# Patient Record
Sex: Male | Born: 1960 | Race: Asian | Hispanic: No | Marital: Single | Smoking: Current every day smoker
Health system: Southern US, Community
[De-identification: ages and names within clinical notes are randomized; demographics above are authoritative.]

## PROBLEM LIST (undated history)

## (undated) DIAGNOSIS — R51 Headache: Secondary | ICD-10-CM

## (undated) DIAGNOSIS — I10 Essential (primary) hypertension: Secondary | ICD-10-CM

## (undated) DIAGNOSIS — R519 Headache, unspecified: Secondary | ICD-10-CM

## (undated) DIAGNOSIS — H269 Unspecified cataract: Secondary | ICD-10-CM

## (undated) HISTORY — DX: Unspecified cataract: H26.9

## (undated) HISTORY — DX: Essential (primary) hypertension: I10

## (undated) HISTORY — PX: OTHER SURGICAL HISTORY: SHX169

---

## 2005-07-04 ENCOUNTER — Emergency Department (HOSPITAL_COMMUNITY): Admission: EM | Admit: 2005-07-04 | Discharge: 2005-07-04 | Payer: Self-pay | Admitting: Emergency Medicine

## 2010-07-30 ENCOUNTER — Emergency Department (HOSPITAL_COMMUNITY): Payer: No Typology Code available for payment source

## 2010-07-30 ENCOUNTER — Emergency Department (HOSPITAL_COMMUNITY)
Admission: EM | Admit: 2010-07-30 | Discharge: 2010-07-30 | Disposition: A | Discharge status: Home / Self Care | Payer: No Typology Code available for payment source | Attending: Emergency Medicine | Admitting: Emergency Medicine

## 2010-07-30 DIAGNOSIS — S139XXA Sprain of joints and ligaments of unspecified parts of neck, initial encounter: Secondary | ICD-10-CM | POA: Insufficient documentation

## 2010-07-30 DIAGNOSIS — Y92410 Unspecified street and highway as the place of occurrence of the external cause: Secondary | ICD-10-CM | POA: Insufficient documentation

## 2014-04-17 ENCOUNTER — Ambulatory Visit (HOSPITAL_BASED_OUTPATIENT_CLINIC_OR_DEPARTMENT_OTHER): Payer: Medicaid Other | Admitting: *Deleted

## 2014-04-17 ENCOUNTER — Encounter: Payer: Self-pay | Admitting: Internal Medicine

## 2014-04-17 ENCOUNTER — Ambulatory Visit: Payer: Medicaid Other | Attending: Internal Medicine | Admitting: Internal Medicine

## 2014-04-17 VITALS — BP 168/105 | HR 81 | Temp 97.8°F | Resp 16 | Ht 63.0 in | Wt 121.0 lb

## 2014-04-17 DIAGNOSIS — F17210 Nicotine dependence, cigarettes, uncomplicated: Secondary | ICD-10-CM | POA: Diagnosis not present

## 2014-04-17 DIAGNOSIS — M542 Cervicalgia: Secondary | ICD-10-CM | POA: Insufficient documentation

## 2014-04-17 DIAGNOSIS — Z23 Encounter for immunization: Secondary | ICD-10-CM

## 2014-04-17 DIAGNOSIS — Z720 Tobacco use: Secondary | ICD-10-CM

## 2014-04-17 DIAGNOSIS — H269 Unspecified cataract: Secondary | ICD-10-CM | POA: Insufficient documentation

## 2014-04-17 DIAGNOSIS — M25512 Pain in left shoulder: Secondary | ICD-10-CM | POA: Insufficient documentation

## 2014-04-17 DIAGNOSIS — Z0000 Encounter for general adult medical examination without abnormal findings: Secondary | ICD-10-CM

## 2014-04-17 DIAGNOSIS — Z560 Unemployment, unspecified: Secondary | ICD-10-CM | POA: Insufficient documentation

## 2014-04-17 DIAGNOSIS — F17200 Nicotine dependence, unspecified, uncomplicated: Secondary | ICD-10-CM
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
## 2014-04-17 NOTE — Patient Instructions (Signed)
Cataract °A cataract is a clouding of the lens of the eye. When a lens becomes cloudy, vision is reduced based on the degree and nature of the clouding. Many cataracts reduce vision to some degree. Some cataracts make people more near-sighted as they develop. Other cataracts increase glare. Cataracts that are ignored and become worse can sometimes look white. The white color can be seen through the pupil. °CAUSES  °· Aging. However, cataracts may occur at any age, even in newborns. °· Certain drugs. °· Trauma to the eye. °· Certain diseases such as diabetes. °· Specific eye diseases such as chronic inflammation inside the eye or a sudden attack of a rare form of glaucoma. °· Inherited or acquired medical problems. °SYMPTOMS  °· Gradual, progressive drop in vision in the affected eye. °· Severe, rapid visual loss. This most often happens when trauma is the cause. °DIAGNOSIS  °To detect a cataract, an eye doctor examines the lens. Cataracts are best diagnosed with an exam of the eyes with the pupils enlarged (dilated) by drops.  °TREATMENT  °For an early cataract, vision may improve by using different eyeglasses or stronger lighting. If that does not help your vision, surgery is the only effective treatment. A cataract needs to be surgically removed when vision loss interferes with your everyday activities, such as driving, reading, or watching TV. A cataract may also have to be removed if it prevents examination or treatment of another eye problem. Surgery removes the cloudy lens and usually replaces it with a substitute lens (intraocular lens, IOL).  °At a time when both you and your doctor agree, the cataract will be surgically removed. If you have cataracts in both eyes, only one is usually removed at a time. This allows the operated eye to heal and be out of danger from any possible problems after surgery (such as infection or poor wound healing). In rare cases, a cataract may be doing damage to your eye. In  these cases, your caregiver may advise surgical removal right away. The vast majority of people who have cataract surgery have better vision afterward. °HOME CARE INSTRUCTIONS  °If you are not planning surgery, you may be asked to do the following: °· Use different eyeglasses. °· Use stronger or brighter lighting. °· Ask your eye doctor about reducing your medicine dose or changing medicines if it is thought that a medicine caused your cataract. Changing medicines does not make the cataract go away on its own. °· Become familiar with your surroundings. Poor vision can lead to injury. Avoid bumping into things on the affected side. You are at a higher risk for tripping or falling. °· Exercise extreme care when driving or operating machinery. °· Wear sunglasses if you are sensitive to bright light or experiencing problems with glare. °SEEK IMMEDIATE MEDICAL CARE IF:  °· You have a worsening or sudden vision loss. °· You notice redness, swelling, or increasing pain in the eye. °· You have a fever. °Document Released: 04/21/2005 Document Revised: 07/14/2011 Document Reviewed: 12/13/2010 °ExitCare® Patient Information ©2015 [employer]. This information is not intended to replace advice given to you by your health care provider. Make sure you discuss any questions you have with your health care provider. ° °

## 2014-05-01 ENCOUNTER — Ambulatory Visit: Payer: Medicaid Other | Attending: Internal Medicine

## 2014-05-01 DIAGNOSIS — Z0000 Encounter for general adult medical examination without abnormal findings: Secondary | ICD-10-CM

## 2014-05-01 LAB — CBC
HCT: 47.6 % (ref 39.0–52.0)
HEMOGLOBIN: 16.6 g/dL (ref 13.0–17.0)
MCH: 26 pg (ref 26.0–34.0)
MCHC: 34.9 g/dL (ref 30.0–36.0)
MCV: 74.6 fL — ABNORMAL LOW (ref 78.0–100.0)
MPV: 10.3 fL (ref 9.4–12.4)
PLATELETS: 231 10*3/uL (ref 150–400)
RBC: 6.38 MIL/uL — ABNORMAL HIGH (ref 4.22–5.81)
RDW: 15 % (ref 11.5–15.5)
WBC: 8.6 10*3/uL (ref 4.0–10.5)

## 2014-05-01 LAB — COMPLETE METABOLIC PANEL WITH GFR
ALBUMIN: 4.6 g/dL (ref 3.5–5.2)
ALT: 14 U/L (ref 0–53)
AST: 17 U/L (ref 0–37)
Alkaline Phosphatase: 97 U/L (ref 39–117)
BUN: 12 mg/dL (ref 6–23)
CALCIUM: 10.2 mg/dL (ref 8.4–10.5)
CHLORIDE: 103 meq/L (ref 96–112)
CO2: 28 mEq/L (ref 19–32)
Creat: 1.01 mg/dL (ref 0.50–1.35)
GFR, Est African American: >89 mL/min
GFR, Est Non African American: 83 mL/min
GLUCOSE: 115 mg/dL — AB (ref 70–99)
POTASSIUM: 5.5 meq/L — AB (ref 3.5–5.3)
Sodium: 138 mEq/L (ref 135–145)
Total Bilirubin: 0.6 mg/dL (ref 0.2–1.2)
Total Protein: 7.2 g/dL (ref 6.0–8.3)

## 2014-05-01 LAB — LIPID PANEL
CHOLESTEROL: 169 mg/dL (ref 0–200)
HDL: 56 mg/dL (ref >39)
LDL Cholesterol: 86 mg/dL (ref 0–99)
Total CHOL/HDL Ratio: 3 Ratio
Triglycerides: 134 mg/dL (ref <150)
VLDL: 27 mg/dL (ref 0–40)

## 2014-05-01 LAB — HEMOGLOBIN A1C
Hgb A1c MFr Bld: 5.6 % (ref <5.7)
Mean Plasma Glucose: 114 mg/dL (ref <117)

## 2014-05-02 LAB — TSH: TSH: 0.76 u[IU]/mL (ref 0.350–4.500)

## 2014-05-02 LAB — PSA: PSA: 2.17 ng/mL (ref <=4.00)

## 2014-05-09 ENCOUNTER — Telehealth: Payer: Self-pay | Admitting: Internal Medicine

## 2014-05-09 NOTE — Telephone Encounter (Signed)
Pt requesting lab results  please result

## 2014-05-09 NOTE — Telephone Encounter (Signed)
Patient is calling to request his blood work results, patient states that he needs to know the results in order to get eye surgery. Please f/u with pt.

## 2014-05-10 ENCOUNTER — Telehealth: Payer: Self-pay | Admitting: Emergency Medicine

## 2014-05-10 ENCOUNTER — Other Ambulatory Visit: Payer: Self-pay | Admitting: *Deleted

## 2014-05-10 ENCOUNTER — Telehealth: Payer: Self-pay | Admitting: Internal Medicine

## 2014-05-10 DIAGNOSIS — E875 Hyperkalemia: Secondary | ICD-10-CM

## 2014-05-10 MED ORDER — FUROSEMIDE 20 MG PO TABS: 20.0000 mg | ORAL_TABLET | Freq: Every day | ORAL | Status: DC

## 2014-05-10 NOTE — Telephone Encounter (Signed)
Pt explained why he needs to take Lasix medication Instructed pt to take medication x 3 dys and return to clinic for repeat BMET Scheduled 05/15/14 @ 10am Future order placed  Pt also informed of freq urination  Verbalized understanding Stated BP has been elevated but unsure of readings

## 2014-05-10 NOTE — Telephone Encounter (Signed)
Pt aware of lab results Lasix send to [facility] BMET was placed

## 2014-05-10 NOTE — Telephone Encounter (Signed)
-----   Message from Ambrose FinlandValerie A Keck, NP sent at 05/09/2014  6:57 PM EST ----- Patients potassium is elevated.  Make sure he is not taking any supplements, if he is he needs to discontinue use. Send him lasix 20 mg to take once per day for 3 days and then have him to come back for a redraw.  Explain that this will make him urinate often in order to pull some of the potassium out of his body. Please place order for future bmet. Thanks

## 2014-05-10 NOTE — Telephone Encounter (Signed)
Patient has called in today to see if he can request a medication refill; patient states BP is still elevated but does not have an appointment until next week 1/13; please f/u with patient to begin treatment

## 2014-05-15 ENCOUNTER — Other Ambulatory Visit: Payer: Medicaid Other

## 2014-05-17 ENCOUNTER — Telehealth: Payer: Self-pay | Admitting: Internal Medicine

## 2014-05-17 ENCOUNTER — Other Ambulatory Visit: Payer: Self-pay

## 2014-05-17 ENCOUNTER — Ambulatory Visit: Payer: Medicaid Other | Attending: Internal Medicine | Admitting: Internal Medicine

## 2014-05-17 ENCOUNTER — Ambulatory Visit (HOSPITAL_COMMUNITY)
Admission: RE | Admit: 2014-05-17 | Discharge: 2014-05-17 | Disposition: A | Discharge status: Home / Self Care | Payer: Medicaid Other | Source: Ambulatory Visit | Attending: Cardiology | Admitting: Cardiology

## 2014-05-17 ENCOUNTER — Ambulatory Visit: Payer: Medicaid Other

## 2014-05-17 ENCOUNTER — Encounter: Payer: Self-pay | Admitting: Internal Medicine

## 2014-05-17 VITALS — BP 165/101 | HR 66 | Temp 98.3°F | Resp 16 | Ht 63.0 in | Wt 121.0 lb

## 2014-05-17 DIAGNOSIS — E875 Hyperkalemia: Secondary | ICD-10-CM | POA: Diagnosis not present

## 2014-05-17 DIAGNOSIS — I10 Essential (primary) hypertension: Secondary | ICD-10-CM | POA: Insufficient documentation

## 2014-05-17 DIAGNOSIS — F17210 Nicotine dependence, cigarettes, uncomplicated: Secondary | ICD-10-CM | POA: Insufficient documentation

## 2014-05-17 DIAGNOSIS — Z79899 Other long term (current) drug therapy: Secondary | ICD-10-CM | POA: Insufficient documentation

## 2014-05-17 LAB — BASIC METABOLIC PANEL
BUN: 18 mg/dL (ref 6–23)
CALCIUM: 10 mg/dL (ref 8.4–10.5)
CO2: 25 meq/L (ref 19–32)
Chloride: 102 mEq/L (ref 96–112)
Creat: 0.89 mg/dL (ref 0.50–1.35)
GLUCOSE: 92 mg/dL (ref 70–99)
Potassium: 5.5 mEq/L — ABNORMAL HIGH (ref 3.5–5.3)
SODIUM: 138 meq/L (ref 135–145)

## 2014-05-17 MED ORDER — AMLODIPINE BESYLATE 5 MG PO TABS: 5.0000 mg | ORAL_TABLET | Freq: Every day | ORAL | Status: DC

## 2014-05-17 NOTE — Patient Instructions (Signed)

## 2014-05-17 NOTE — Telephone Encounter (Signed)
Medical clearance for Dr. Harlon FlorWhitaker for patient to have eye surgery was faxed over today to [phone].

## 2014-05-17 NOTE — Progress Notes (Signed)
Pt is here following up on his HTN. Pt states that is his pressure is high that he wants to be on a BP medication.

## 2014-05-17 NOTE — Telephone Encounter (Signed)
Dr. Hosie PoissonWhitaker's office called to check on the status of the request for clearance in order for the patient to have cataracts surgery. Please f/u with office for more information.

## 2014-05-17 NOTE — Progress Notes (Signed)
Patient ID: [name] [name], male   DOB: 11/05/1960, 54 y.o.   MRN: [id number]  CC: elevated BP   HPI: [name] [name] is a 54 y.o. male here today for a follow up visit.  Patient has no past medical history.  Patient reports that every time he came to the clinic to have his BP rechecked they sent him home.  He states that the pharmacy close to his house has had a broken BP machine.  He states that he has been worried about his blood pressure. He denies headache, chest pressure, or palpitations.  He only reports moderate back pain.     No Known Allergies History reviewed. No pertinent past medical history. Current Outpatient Prescriptions on File Prior to Visit  Medication Sig Dispense Refill  . furosemide (LASIX) 20 MG tablet Take 1 tablet (20 mg total) by mouth daily. (Patient not taking: Reported on 05/17/2014) 3 tablet 0   No current facility-administered medications on file prior to visit.   History reviewed. No pertinent family history. History   Social History  . Marital Status: Single    Spouse Name: N/A    Number of Children: N/A  . Years of Education: N/A   Occupational History  . Not on file.   Social History Main Topics  . Smoking status: Heavy Tobacco Smoker  . Smokeless tobacco: Not on file  . Alcohol Use: No  . Drug Use: No  . Sexual Activity: No   Other Topics Concern  . Not on file   Social History Narrative    Review of Systems: See HPI  Objective:   Filed Vitals:   05/17/14 1441  BP: 165/101  Pulse: 66  Temp: 98.3 F (36.8 C)  Resp: 16    Physical Exam  Constitutional: He is oriented to person, place, and time.  Cardiovascular: Normal rate, regular rhythm and normal heart sounds.   Pulmonary/Chest: Effort normal and breath sounds normal.  Neurological: He is alert and oriented to person, place, and time.     Lab Results  Component Value Date   WBC 8.6 05/01/2014   HGB 16.6 05/01/2014   HCT 47.6 05/01/2014   MCV 74.6* 05/01/2014   PLT 231  05/01/2014   Lab Results  Component Value Date   CREATININE 1.01 05/01/2014   BUN 12 05/01/2014   NA 138 05/01/2014   K 5.5* 05/01/2014   CL 103 05/01/2014   CO2 28 05/01/2014    Lab Results  Component Value Date   HGBA1C 5.6 05/01/2014   Lipid Panel     Component Value Date/Time   CHOL 169 05/01/2014 1020   TRIG 134 05/01/2014 1020   HDL 56 05/01/2014 1020   CHOLHDL 3.0 05/01/2014 1020   VLDL 27 05/01/2014 1020   LDLCALC 86 05/01/2014 1020       Assessment and plan:   [name] [name] was seen today for follow-up.  Diagnoses and associated orders for this visit:  Essential hypertension - amLODipine (NORVASC) 5 MG tablet; Take 1 tablet (5 mg total) by mouth daily. - EKG 12-Lead  Hyperkalemia - Basic Metabolic Panel Normal EKG  Return in about 1 week (around 05/24/2014) for Nurse Visit-BP check and 3 mo PCP.        Holland CommonsKECK, Yeiden Frenkel, NP-C [facility] [phone] 05/17/2014, 3:19 PM

## 2014-05-28 ENCOUNTER — Other Ambulatory Visit: Payer: Self-pay | Admitting: Ophthalmology

## 2014-05-28 MED ORDER — TETRACAINE HCL 0.5 % OP SOLN: 1.0000 [drp] | OPHTHALMIC | Status: AC

## 2014-05-28 NOTE — H&P (Signed)
                  History & Physical:   DATE:  05-19-14   NAME:  [name], [name]      [id number]       HISTORY OF PRESENT ILLNESS: Chief Eye Complaints  Cataract: Patient states my glasses doesn't work for me anymore due ot the cataracts. Patient c/o seeing floaters. 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 Brewington's notes.    HPI: EYES: Reports symptoms of  LOCATION:   BOTH EYES        QUALITY/COURSE:   Reports condition is worsening.        INTENSITY/SEVERITY:    Reports measurement ( or degree) as severe .      DURATION:   Reports the general length of symptoms to be years.                 ACTIVE PROBLEMS: Combined forms of age-related cataract, bilateral   ICD10: H25.813  ICD9:   Onset: 04/05/2014 16:10  Initial Date:     Hypertension   ICD10:   ICD9: 401.9  Onset: 04/05/2014 17:30  Initial Date:  SURGERIES: MEDICATIONS: Ilevro: Strength-  SIG-   Ciloxan (Ciprofloxacin) Solution: 0.3% solution SIG-  1 drop(s)  every 4 hours  REVIEW OF SYSTEMS: ROS:   GEN- Constitutional: HENT: GEN - Endocrine: Reports symptoms of LUNGS/Respiratory:  HEART/Cardiovascular: Reports symptoms of ABD/Gastrointestinal:  Musculoskeletal (BJE): +++++      arthralgias    shoulder pain or problems   NEURO/Neurological: PSYCH/Psychiatric:    Is the pt oriented to time, place, person? YES  Mood depressed __ normal   TOBACCO Smoker Status:   Current every day smoker   ICD10: Z72.0 ICD9: 305.1 Onset: 04/05/2014 16:09    Smoker Status:  SOCIAL HISTORY: Starter Pick List - Social History  FAMILY HISTORY:  Family History - 1st Degree Relatives:  Daughter alive and well.   Family History - 1st Degree Relatives:  ALLERGIES: Drug Allergies.  No Known.   Starter - Allergies - Summary:  PHYSICAL EXAMINATION: VS: BMI: 20.0.  BP: 161/102.  H: 65.00 in.  P: 72 /min.  W: 120lbs 0oz.   149/96 @ 5:34  Va     OD [place] 20/400         cc  20/CF OS [place] 20/CF         cc 20/CF [place] 20/400  EYEGLASSES:  OD +2.25 sph  OS: +2.25 +0.25 x142 ADD: +2.50   Unable to get AR  MR 04/05/2014 16:16  OD: +2.25 sph 20/CF OS: +2.75 +0.25 x142 CF ADD: unable   K's OD: 43.75 44.25 OS: 44.00 44.50  VF:  decrease vision in all four quadrants [place]  Motility orthophoria and full  PUPILS: 3mm reactive -mg  EYELIDS & OCULAR ADNEXA normal each eye  SLE: Conjunctiva Pinguecula each eye  Cornea Arcus each eye   anterior chamber  deep and quiet each eye  Iris brown each eye  Lens +3 nuclear sclerosis plus to 3 posterior subcapsular opacity  Vitreous Clear os  CCT  Ta   in mmHg    OD   16         OS 16 Time 04/05/2014 16:30   Gonio   Dilation phenyl 2.5% and trop 1%  Fundus:  optic nerve  OD  tilted nerve with pericapillary atrophy 30% cup pink color                                                                     OS difficult view secondary to cataract   Macula      OD     CLEAR                                               OS .  Difficulty seeing from cataract  Vessels narrow arterioles  Periphery Normal OD     Exam: GENERAL: Appearance: HEAD, EARS, NOSE AND THROAT: Ears-Nose (external) Inspection: Externally, nose and ears are normal in appearance and without scars, lesions, or nodules.      Hearing assessment shows no problems with normal conversation.      LUNGS and RESPIRATORY: Lung auscultation elicits no wheezing, rhonci, rales or rubs and with equal breath sounds.    Respiratory effort described as breathing is unlabored and chest movement is symmetrical.    HEART (Cardiovascular): Heart auscultation discovers regular rate and rhythm; no murmur, gallop or rub. Normal heart sounds.    ABDOMEN (Gastrointestinal): Mass/Tenderness Exam: Neither are present.     MUSCULOSKELETAL (BJE): Inspection-Palpation: No major bone, joint, tendon, or muscle changes.      NEUROLOGICAL: Alert and oriented. No major  deficits of coordination or sensation.      PSYCHIATRIC: Insight and judgment appear  both to be intact and appropriate.    Mood and affect are described as normal mood and full affect.    SKIN: Skin Inspection: No rashes or lesions  ADMITTING DIAGNOSIS: Combined forms of age-related cataract, bilateral   ICD10: H25.813  ICD9:   Onset: 04/05/2014 16:10    Hypertension   ICD10:   ICD9: 401.9  Onset: 04/05/2014 17:30  Initial Date:  SURGICAL TREATMENT PLAN: clearance from   Dr. Avbuere     phaco emulsion cataract extraction with intraocular lens implant left eye  Risk and benefits of surgery have been reviewed with the patient and the patient agrees to proceed with the surgical procedure.   Actions:     Handouts: New Handout, What is a cataract?.    ___________________________ Bradd Merlos, Jr. Starter - Inactive Problems:  

## 2014-05-29 ENCOUNTER — Ambulatory Visit: Payer: Medicaid Other | Admitting: *Deleted

## 2014-05-29 VITALS — BP 149/82 | HR 82 | Temp 97.8°F | Resp 18 | Ht 63.0 in | Wt 120.8 lb

## 2014-05-29 DIAGNOSIS — I10 Essential (primary) hypertension: Secondary | ICD-10-CM

## 2014-05-29 MED ORDER — AMLODIPINE BESYLATE 10 MG PO TABS: 10.0000 mg | ORAL_TABLET | Freq: Every day | ORAL | Status: DC

## 2014-05-29 NOTE — Progress Notes (Signed)
Patient presents for BP check Med list reviewed; states taking all meds as directed Discussed need for low sodium diet and using Mrs. Dash as alternative to salt  BP 149/82 P 82 R 18  T  97.8 oral SPO2  98%  Per PCP: Increase amlodipine to 10 mg daily Return in 2 weeks for nurse visit for BP check   Patient given literature on DASH Eating Plan  Patient advised to call for med refills at least 7 days before running out so as not to go without.

## 2014-05-29 NOTE — Patient Instructions (Signed)
DASH Eating Plan °DASH stands for "Dietary Approaches to Stop Hypertension." The DASH eating plan is a healthy eating plan that has been shown to reduce high blood pressure (hypertension). Additional health benefits may include reducing the risk of type 2 diabetes mellitus, heart disease, and stroke. The DASH eating plan may also help with weight loss. °WHAT DO I NEED TO KNOW ABOUT THE DASH EATING PLAN? °For the DASH eating plan, you will follow these general guidelines: °· Choose foods with a percent daily value for sodium of less than 5% (as listed on the food label). °· Use salt-free seasonings or herbs instead of table salt or sea salt. °· Check with your health care provider or pharmacist before using salt substitutes. °· Eat lower-sodium products, often labeled as "lower sodium" or "no salt added." °· Eat fresh foods. °· Eat more vegetables, fruits, and low-fat dairy products. °· Choose whole grains. Look for the word "whole" as the first word in the ingredient list. °· Choose fish and skinless chicken or turkey more often than red meat. Limit fish, poultry, and meat to 6 oz (170 g) each day. °· Limit sweets, desserts, sugars, and sugary drinks. °· Choose heart-healthy fats. °· Limit cheese to 1 oz (28 g) per day. °· Eat more home-cooked food and less restaurant, buffet, and fast food. °· Limit fried foods. °· Cook foods using methods other than frying. °· Limit canned vegetables. If you do use them, rinse them well to decrease the sodium. °· When eating at a restaurant, ask that your food be prepared with less salt, or no salt if possible. °WHAT FOODS CAN I EAT? °Seek help from a dietitian for individual calorie needs. °Grains °Whole grain or whole wheat bread. Brown rice. Whole grain or whole wheat pasta. Quinoa, bulgur, and whole grain cereals. Low-sodium cereals. Corn or whole wheat flour tortillas. Whole grain cornbread. Whole grain crackers. Low-sodium crackers. °Vegetables °Fresh or frozen vegetables  (raw, steamed, roasted, or grilled). Low-sodium or reduced-sodium tomato and vegetable juices. Low-sodium or reduced-sodium tomato sauce and paste. Low-sodium or reduced-sodium canned vegetables.  °Fruits °All fresh, canned (in natural juice), or frozen fruits. °Meat and Other Protein Products °Ground beef (85% or leaner), grass-fed beef, or beef trimmed of fat. Skinless chicken or turkey. Ground chicken or turkey. Pork trimmed of fat. All fish and seafood. Eggs. Dried beans, peas, or lentils. Unsalted nuts and seeds. Unsalted canned beans. °Dairy °Low-fat dairy products, such as skim or 1% milk, 2% or reduced-fat cheeses, low-fat ricotta or cottage cheese, or plain low-fat yogurt. Low-sodium or reduced-sodium cheeses. °Fats and Oils °Tub margarines without trans fats. Light or reduced-fat mayonnaise and salad dressings (reduced sodium). Avocado. Safflower, olive, or canola oils. Natural peanut or almond butter. °Other °Unsalted popcorn and pretzels. °The items listed above may not be a complete list of recommended foods or beverages. Contact your dietitian for more options. °WHAT FOODS ARE NOT RECOMMENDED? °Grains °White bread. White pasta. White rice. Refined cornbread. Bagels and croissants. Crackers that contain trans fat. °Vegetables °Creamed or fried vegetables. Vegetables in a cheese sauce. Regular canned vegetables. Regular canned tomato sauce and paste. Regular tomato and vegetable juices. °Fruits °Dried fruits. Canned fruit in light or heavy syrup. Fruit juice. °Meat and Other Protein Products °Fatty cuts of meat. Ribs, chicken wings, bacon, sausage, bologna, salami, chitterlings, fatback, hot dogs, bratwurst, and packaged luncheon meats. Salted nuts and seeds. Canned beans with salt. °Dairy °Whole or 2% milk, cream, half-and-half, and cream cheese. Whole-fat or sweetened yogurt. Full-fat   cheeses or blue cheese. Nondairy creamers and whipped toppings. Processed cheese, cheese spreads, or cheese  curds. °Condiments °Onion and garlic salt, seasoned salt, table salt, and sea salt. Canned and packaged gravies. Worcestershire sauce. Tartar sauce. Barbecue sauce. Teriyaki sauce. Soy sauce, including reduced sodium. Steak sauce. Fish sauce. Oyster sauce. Cocktail sauce. Horseradish. Ketchup and mustard. Meat flavorings and tenderizers. Bouillon cubes. Hot sauce. Tabasco sauce. Marinades. Taco seasonings. Relishes. °Fats and Oils °Butter, stick margarine, lard, shortening, ghee, and bacon fat. Coconut, palm kernel, or palm oils. Regular salad dressings. °Other °Pickles and olives. Salted popcorn and pretzels. °The items listed above may not be a complete list of foods and beverages to avoid. Contact your dietitian for more information. °WHERE CAN I FIND MORE INFORMATION? °National Heart, Lung, and [employer] Institute: www.nhlbi.nih.gov/health/health-topics/topics/dash/ °Document Released: 04/10/2011 Document Revised: 09/05/2013 Document Reviewed: 02/23/2013 °ExitCare® Patient Information ©2015 [employer]. This information is not intended to replace advice given to you by your health care provider. Make sure you discuss any questions you have with your health care provider. ° °

## 2014-05-30 ENCOUNTER — Encounter (HOSPITAL_COMMUNITY)
Admission: RE | Admit: 2014-05-30 | Discharge: 2014-05-30 | Disposition: A | Discharge status: Home / Self Care | Payer: Medicaid Other | Source: Ambulatory Visit | Attending: Ophthalmology | Admitting: Ophthalmology

## 2014-05-30 ENCOUNTER — Encounter (HOSPITAL_COMMUNITY): Payer: Self-pay

## 2014-05-30 DIAGNOSIS — I10 Essential (primary) hypertension: Secondary | ICD-10-CM | POA: Diagnosis not present

## 2014-05-30 DIAGNOSIS — H269 Unspecified cataract: Secondary | ICD-10-CM | POA: Diagnosis present

## 2014-05-30 DIAGNOSIS — H53141 Visual discomfort, right eye: Secondary | ICD-10-CM | POA: Diagnosis not present

## 2014-05-30 DIAGNOSIS — F17200 Nicotine dependence, unspecified, uncomplicated: Secondary | ICD-10-CM | POA: Diagnosis not present

## 2014-05-30 HISTORY — DX: Headache: R51

## 2014-05-30 HISTORY — DX: Headache, unspecified: R51.9

## 2014-05-30 LAB — BASIC METABOLIC PANEL
ANION GAP: 8 (ref 5–15)
BUN: 14 mg/dL (ref 6–23)
CHLORIDE: 102 mmol/L (ref 96–112)
CO2: 26 mmol/L (ref 19–32)
Calcium: 9.6 mg/dL (ref 8.4–10.5)
Creatinine, Ser: 0.93 mg/dL (ref 0.50–1.35)
Glucose, Bld: 106 mg/dL — ABNORMAL HIGH (ref 70–99)
POTASSIUM: 4.4 mmol/L (ref 3.5–5.1)
SODIUM: 136 mmol/L (ref 135–145)

## 2014-05-30 LAB — CBC
HCT: 46.3 % (ref 39.0–52.0)
HEMOGLOBIN: 15.6 g/dL (ref 13.0–17.0)
MCH: 26 pg (ref 26.0–34.0)
MCHC: 33.7 g/dL (ref 30.0–36.0)
MCV: 77 fL — ABNORMAL LOW (ref 78.0–100.0)
Platelets: 213 10*3/uL (ref 150–400)
RBC: 6.01 MIL/uL — ABNORMAL HIGH (ref 4.22–5.81)
RDW: 14.5 % (ref 11.5–15.5)
WBC: 10.4 10*3/uL (ref 4.0–10.5)

## 2014-05-30 MED ORDER — CYCLOPENTOLATE HCL 1 % OP SOLN
1.0000 [drp] | OPHTHALMIC | Status: AC
  Administered 2014-05-31: 1 [drp] via OPHTHALMIC
  Filled 2014-05-30: qty 2

## 2014-05-30 MED ORDER — PHENYLEPHRINE HCL 2.5 % OP SOLN
1.0000 [drp] | OPHTHALMIC | Status: AC
Start: 2014-05-31 — End: 2014-05-31
  Administered 2014-05-31: 1 [drp] via OPHTHALMIC
  Filled 2014-05-30: qty 2

## 2014-05-30 MED ORDER — GATIFLOXACIN 0.5 % OP SOLN
1.0000 [drp] | OPHTHALMIC | Status: AC | PRN
  Administered 2014-05-31 (×3): 1 [drp] via OPHTHALMIC
  Filled 2014-05-30 (×2): qty 2.5

## 2014-05-30 MED ORDER — TROPICAMIDE 1 % OP SOLN
1.0000 [drp] | OPHTHALMIC | Status: AC
  Administered 2014-05-31: 1 [drp] via OPHTHALMIC
  Filled 2014-05-30: qty 3

## 2014-05-30 MED ORDER — KETOROLAC TROMETHAMINE 0.5 % OP SOLN
1.0000 [drp] | OPHTHALMIC | Status: AC
  Administered 2014-05-31: 1 [drp] via OPHTHALMIC
  Filled 2014-05-30: qty 5

## 2014-05-30 NOTE — Pre-Procedure Instructions (Addendum)
[name] [name]  05/30/2014   Your procedure is scheduled on:  Wednesday, January 27.  [facility] at 7:30 AM.  Call this number if you have problems the morning of surgery: [phone]   Remember:   Do not eat food or drink liquids after midnight.   Take these medicines the morning of surgery with A SIP OF WATER: amLODipine (NORVASC).    Do not wear jewelry, make-up or nail polish.  Do not wear lotions, powders, or perfumes.              Men may shave face and neck.  Do not bring valuables to the hospital.              [facility] is not responsible for any belongings or valuables.               Contacts, dentures or bridgework may not be worn into surgery.  Leave suitcase in the car. After surgery it may be brought to your room.  For patients admitted to the hospital, discharge time is determined by your  treatment team.               Patients discharged the day of surgery will not be allowed to drive home.  Name and phone number of your driver: -   Special Instructions: Review  [facility] - Preparing For Surgery.   Please read over the following fact sheets that you were given: Pain Booklet, Coughing and Deep Breathing and Surgical Site Infection Prevention

## 2014-05-30 NOTE — Progress Notes (Signed)
Pt. Verbalizing Dr.whitaker was operating on both eyes. Pt. Stating the earliest he could arrange transportation to pick him up was 7:10 am [facility]. Pt. Also stated he didn't have anyone to stay with him after surgery.  Notified Dr. Harlon FlorWhitaker. Dr. Harlon FlorWhitaker spoke to patient on the phone. Pt. Understands the doctor will operate on the left eye only . Pt. Will try and find a friend to take him home or  to ride the transportation bus home with him and also stay the night. Dr. Harlon FlorWhitaker changed the surgery time from 8:30 am to 9:30 am due to patient not being able to be picked up til 7:10 am.

## 2014-05-30 NOTE — Anesthesia Preprocedure Evaluation (Addendum)
Anesthesia Evaluation  Patient identified by MRN, date of birth, ID band Patient awake    Reviewed: Allergy & Precautions, NPO status , Patient's Chart, lab work & pertinent test results, reviewed documented beta blocker date and time   Airway Mallampati: II   Neck ROM: Full    Dental  (+) Edentulous Upper, Poor Dentition   Pulmonary Current Smoker,  breath sounds clear to auscultation        Cardiovascular hypertension, Pt. on medications  EKG 05/2014 WNL   Neuro/Psych    GI/Hepatic negative GI ROS, Neg liver ROS,   Endo/Other  negative endocrine ROS  Renal/GU negative Renal ROS     Musculoskeletal   Abdominal (+)  Abdomen: soft.    Peds  Hematology   Anesthesia Other Findings   Reproductive/Obstetrics                           Anesthesia Physical Anesthesia Plan  ASA: II  Anesthesia Plan: MAC   Post-op Pain Management:    Induction:   Airway Management Planned: Nasal Cannula  Additional Equipment:   Intra-op Plan:   Post-operative Plan:   Informed Consent: I have reviewed the patients History and Physical, chart, labs and discussed the procedure including the risks, benefits and alternatives for the proposed anesthesia with the patient or authorized representative who has indicated his/her understanding and acceptance.     Plan Discussed with:   Anesthesia Plan Comments:         Anesthesia Quick Evaluation

## 2014-05-31 ENCOUNTER — Ambulatory Visit (HOSPITAL_COMMUNITY): Payer: Medicaid Other | Admitting: Anesthesiology

## 2014-05-31 ENCOUNTER — Encounter (HOSPITAL_COMMUNITY): Payer: Self-pay | Admitting: *Deleted

## 2014-05-31 ENCOUNTER — Encounter (HOSPITAL_COMMUNITY)
Admission: RE | Disposition: A | Discharge status: Home / Self Care | Payer: Self-pay | Source: Ambulatory Visit | Attending: Ophthalmology

## 2014-05-31 ENCOUNTER — Ambulatory Visit (HOSPITAL_COMMUNITY)
Admission: RE | Admit: 2014-05-31 | Discharge: 2014-05-31 | Disposition: A | Discharge status: Home / Self Care | Payer: Medicaid Other | Source: Ambulatory Visit | Attending: Ophthalmology | Admitting: Ophthalmology

## 2014-05-31 DIAGNOSIS — I10 Essential (primary) hypertension: Secondary | ICD-10-CM | POA: Insufficient documentation

## 2014-05-31 DIAGNOSIS — H53141 Visual discomfort, right eye: Secondary | ICD-10-CM | POA: Insufficient documentation

## 2014-05-31 DIAGNOSIS — F17200 Nicotine dependence, unspecified, uncomplicated: Secondary | ICD-10-CM | POA: Insufficient documentation

## 2014-05-31 HISTORY — PX: CATARACT EXTRACTION W/PHACO: SHX586

## 2014-05-31 SURGERY — PHACOEMULSIFICATION, CATARACT, WITH IOL INSERTION
Anesthesia: Monitor Anesthesia Care | Site: Eye | Laterality: Left

## 2014-05-31 MED ORDER — MEPERIDINE HCL 25 MG/ML IJ SOLN: 6.2500 mg | INTRAMUSCULAR | Status: DC | PRN

## 2014-05-31 MED ORDER — MIDAZOLAM HCL 2 MG/2ML IJ SOLN
INTRAMUSCULAR | Status: AC
  Filled 2014-05-31: qty 2

## 2014-05-31 MED ORDER — BSS IO SOLN
INTRAOCULAR | Status: AC
  Filled 2014-05-31: qty 15

## 2014-05-31 MED ORDER — PROPOFOL 10 MG/ML IV BOLUS
INTRAVENOUS | Status: DC | PRN
  Administered 2014-05-31: 30 mg via INTRAVENOUS
  Administered 2014-05-31: 10 mg via INTRAVENOUS

## 2014-05-31 MED ORDER — PHENYLEPHRINE HCL 2.5 % OP SOLN
1.0000 [drp] | OPHTHALMIC | Status: AC | PRN
Start: 2014-05-31 — End: 2014-05-31
  Administered 2014-05-31 (×2): 1 [drp] via OPHTHALMIC
  Filled 2014-05-31: qty 2

## 2014-05-31 MED ORDER — PILOCARPINE HCL 4 % OP SOLN
OPHTHALMIC | Status: AC
  Filled 2014-05-31: qty 15

## 2014-05-31 MED ORDER — LIDOCAINE HCL 2 % IJ SOLN
INTRAMUSCULAR | Status: AC
  Filled 2014-05-31: qty 20

## 2014-05-31 MED ORDER — LIDOCAINE-EPINEPHRINE 2 %-1:100000 IJ SOLN
INTRAMUSCULAR | Status: DC | PRN
  Administered 2014-05-31: 4.5 mL via RETROBULBAR

## 2014-05-31 MED ORDER — BSS IO SOLN
INTRAOCULAR | Status: AC
  Filled 2014-05-31: qty 500

## 2014-05-31 MED ORDER — SODIUM HYALURONATE 10 MG/ML IO SOLN
INTRAOCULAR | Status: AC
  Filled 2014-05-31: qty 0.85

## 2014-05-31 MED ORDER — FENTANYL CITRATE 0.05 MG/ML IJ SOLN: 25.0000 ug | INTRAMUSCULAR | Status: DC | PRN

## 2014-05-31 MED ORDER — MIDAZOLAM HCL 5 MG/5ML IJ SOLN
INTRAMUSCULAR | Status: DC | PRN
  Administered 2014-05-31: 2 mg via INTRAVENOUS

## 2014-05-31 MED ORDER — SODIUM HYALURONATE 10 MG/ML IO SOLN
INTRAOCULAR | Status: DC | PRN
  Administered 2014-05-31: 0.85 mL via INTRAOCULAR

## 2014-05-31 MED ORDER — PROMETHAZINE HCL 25 MG/ML IJ SOLN: 6.2500 mg | INTRAMUSCULAR | Status: DC | PRN

## 2014-05-31 MED ORDER — TOBRAMYCIN 0.3 % OP OINT
TOPICAL_OINTMENT | OPHTHALMIC | Status: DC | PRN
  Administered 2014-05-31: 1 via OPHTHALMIC

## 2014-05-31 MED ORDER — TOBRAMYCIN-DEXAMETHASONE 0.3-0.1 % OP OINT
TOPICAL_OINTMENT | OPHTHALMIC | Status: AC
  Filled 2014-05-31: qty 3.5

## 2014-05-31 MED ORDER — EPINEPHRINE HCL 1 MG/ML IJ SOLN
INTRAMUSCULAR | Status: AC
  Filled 2014-05-31: qty 1

## 2014-05-31 MED ORDER — PROPOFOL 10 MG/ML IV BOLUS
INTRAVENOUS | Status: AC
  Filled 2014-05-31: qty 20

## 2014-05-31 MED ORDER — FENTANYL CITRATE 0.05 MG/ML IJ SOLN
INTRAMUSCULAR | Status: AC
  Filled 2014-05-31: qty 5

## 2014-05-31 MED ORDER — KETOROLAC TROMETHAMINE 0.5 % OP SOLN
1.0000 [drp] | OPHTHALMIC | Status: DC | PRN
Start: 2014-05-31 — End: 2014-05-31
  Administered 2014-05-31: 1 [drp] via OPHTHALMIC
  Filled 2014-05-31: qty 3

## 2014-05-31 MED ORDER — PHENYLEPHRINE HCL 10 MG/ML IJ SOLN
INTRAMUSCULAR | Status: DC | PRN
  Administered 2014-05-31 (×2): 80 ug via INTRAVENOUS

## 2014-05-31 MED ORDER — 0.9 % SODIUM CHLORIDE (POUR BTL) OPTIME
TOPICAL | Status: DC | PRN
  Administered 2014-05-31: 100 mL

## 2014-05-31 MED ORDER — CYCLOPENTOLATE HCL 1 % OP SOLN
1.0000 [drp] | OPHTHALMIC | Status: AC | PRN
  Administered 2014-05-31 (×2): 1 [drp] via OPHTHALMIC
  Filled 2014-05-31: qty 2

## 2014-05-31 MED ORDER — TROPICAMIDE 1 % OP SOLN
OPHTHALMIC | Status: AC
  Administered 2014-05-31: 1 [drp] via OPHTHALMIC
  Filled 2014-05-31: qty 3

## 2014-05-31 MED ORDER — DEXAMETHASONE SODIUM PHOSPHATE 10 MG/ML IJ SOLN
INTRAMUSCULAR | Status: AC
  Filled 2014-05-31: qty 1

## 2014-05-31 MED ORDER — TROPICAMIDE 1 % OP SOLN
1.0000 [drp] | Freq: Once | OPHTHALMIC | Status: AC
  Administered 2014-05-31: 1 [drp] via OPHTHALMIC

## 2014-05-31 MED ORDER — ACETYLCHOLINE CHLORIDE 1:100 IO SOLR
INTRAOCULAR | Status: DC | PRN
  Administered 2014-05-31: 10 mg via INTRAOCULAR

## 2014-05-31 MED ORDER — LIDOCAINE-EPINEPHRINE 2 %-1:100000 IJ SOLN
INTRAMUSCULAR | Status: AC
  Filled 2014-05-31: qty 1

## 2014-05-31 MED ORDER — SODIUM CHLORIDE 0.9 % IV SOLN
INTRAVENOUS | Status: DC
  Administered 2014-05-31: 09:00:00 via INTRAVENOUS

## 2014-05-31 MED ORDER — HYALURONIDASE HUMAN 150 UNIT/ML IJ SOLN
INTRAMUSCULAR | Status: AC
  Filled 2014-05-31: qty 1

## 2014-05-31 MED ORDER — GENTAMICIN SULFATE 40 MG/ML IJ SOLN
INTRAMUSCULAR | Status: AC
  Filled 2014-05-31: qty 2

## 2014-05-31 MED ORDER — NA CHONDROIT SULF-NA HYALURON 40-30 MG/ML IO SOLN
INTRAOCULAR | Status: DC | PRN
  Administered 2014-05-31: 0.5 mL via INTRAOCULAR

## 2014-05-31 MED ORDER — NA CHONDROIT SULF-NA HYALURON 40-30 MG/ML IO SOLN
INTRAOCULAR | Status: AC
  Filled 2014-05-31: qty 0.5

## 2014-05-31 MED ORDER — BUPIVACAINE HCL (PF) 0.75 % IJ SOLN
INTRAMUSCULAR | Status: AC
  Filled 2014-05-31: qty 10

## 2014-05-31 MED ORDER — EPINEPHRINE HCL 1 MG/ML IJ SOLN
INTRAOCULAR | Status: DC | PRN
  Administered 2014-05-31: 10:00:00

## 2014-05-31 MED ORDER — ACETYLCHOLINE CHLORIDE 1:100 IO SOLR
INTRAOCULAR | Status: AC
  Filled 2014-05-31: qty 1

## 2014-05-31 MED ORDER — FENTANYL CITRATE 0.05 MG/ML IJ SOLN
INTRAMUSCULAR | Status: DC | PRN
  Administered 2014-05-31: 25 ug via INTRAVENOUS

## 2014-05-31 MED ORDER — PHENYLEPHRINE 40 MCG/ML (10ML) SYRINGE FOR IV PUSH (FOR BLOOD PRESSURE SUPPORT)
PREFILLED_SYRINGE | INTRAVENOUS | Status: AC
  Filled 2014-05-31: qty 10

## 2014-05-31 MED ORDER — TETRACAINE HCL 0.5 % OP SOLN
OPHTHALMIC | Status: AC
  Filled 2014-05-31: qty 2

## 2014-05-31 SURGICAL SUPPLY — 35 items
APL SRG 3 HI ABS STRL LF PLS (MISCELLANEOUS) ×1
APPLICATOR COTTON TIP 6IN STRL (MISCELLANEOUS) ×3 IMPLANT
APPLICATOR DR MATTHEWS STRL (MISCELLANEOUS) ×3 IMPLANT
BLADE KERATOME 2.75 (BLADE) ×2 IMPLANT
BLADE KERATOME 2.75MM (BLADE) ×1
CANNULA ANTERIOR CHAMBER 27GA (MISCELLANEOUS) ×3 IMPLANT
CORDS BIPOLAR (ELECTRODE) IMPLANT
COVER MAYO STAND STRL (DRAPES) ×3 IMPLANT
DRAPE OPHTHALMIC 40X48 W POUCH (DRAPES) ×3 IMPLANT
DRAPE RETRACTOR (MISCELLANEOUS) ×3 IMPLANT
GLOVE BIO SURGEON STRL SZ8 (GLOVE) ×3 IMPLANT
GOWN STRL REUS W/ TWL LRG LVL3 (GOWN DISPOSABLE) ×3 IMPLANT
GOWN STRL REUS W/TWL LRG LVL3 (GOWN DISPOSABLE) ×9
KIT BASIN OR (CUSTOM PROCEDURE TRAY) ×3 IMPLANT
KIT ROOM TURNOVER OR (KITS) ×3 IMPLANT
LENS IOL ACRSF IQ PC 20.0 (Intraocular Lens) IMPLANT
LENS IOL ACRYSOF IQ POST 20.0 (Intraocular Lens) ×3 IMPLANT
NDL 18GX1X1/2 (RX/OR ONLY) (NEEDLE) ×1 IMPLANT
NDL 25GX 5/8IN NON SAFETY (NEEDLE) ×1 IMPLANT
NDL FILTER BLUNT 18X1 1/2 (NEEDLE) ×1 IMPLANT
NEEDLE 18GX1X1/2 (RX/OR ONLY) (NEEDLE) ×3 IMPLANT
NEEDLE 25GX 5/8IN NON SAFETY (NEEDLE) ×3 IMPLANT
NEEDLE FILTER BLUNT 18X 1/2SAF (NEEDLE) ×2
NEEDLE FILTER BLUNT 18X1 1/2 (NEEDLE) ×1 IMPLANT
NS IRRIG 1000ML POUR BTL (IV SOLUTION) ×3 IMPLANT
PACK CATARACT CUSTOM (CUSTOM PROCEDURE TRAY) ×3 IMPLANT
PAD ARMBOARD 7.5X6 YLW CONV (MISCELLANEOUS) ×3 IMPLANT
PAK PIK CVS CATARACT (OPHTHALMIC) ×3 IMPLANT
SUT ETHILON 10 0 CS140 6 (SUTURE) IMPLANT
SUT SILK 6 0 G 6 (SUTURE) IMPLANT
SYR TB 1ML LUER SLIP (SYRINGE) ×3 IMPLANT
TIP ABS 45DEG FLARED 0.9MM (TIP) ×3 IMPLANT
TOWEL OR 17X26 10 PK STRL BLUE (TOWEL DISPOSABLE) ×3 IMPLANT
WATER STERILE IRR 1000ML POUR (IV SOLUTION) ×3 IMPLANT
WIPE INSTRUMENT VISIWIPE 73X73 (MISCELLANEOUS) ×3 IMPLANT

## 2014-05-31 NOTE — Anesthesia Procedure Notes (Signed)
Procedure Name: MAC Date/Time: 05/31/2014 9:45 AM Performed by: Quentin OreWALKER, Easton Fetty E Pre-anesthesia Checklist: Patient identified, Emergency Drugs available, Suction available, Patient being monitored and Timeout performed Patient Re-evaluated:Patient Re-evaluated prior to inductionOxygen Delivery Method: Nasal cannula Intubation Type: IV induction Placement Confirmation: positive ETCO2

## 2014-05-31 NOTE — Interval H&P Note (Signed)
History and Physical Interval Note:  05/31/2014 9:21 AM  [name] [name]  has presented today for surgery, with the diagnosis of CATARACT  The various methods of treatment have been discussed with the patient and family. After consideration of risks, benefits and other options for treatment, the patient has consented to  Procedure(s): CATARACT EXTRACTION PHACO EMOTION AND INTRAOCULAR LENS PLACEMENT (IOC) LEFT EYE (Left) as a surgical intervention .  The patient's history has been reviewed, patient examined, no change in status, stable for surgery.  I have reviewed the patient's chart and labs.  Questions were answered to the patient's satisfaction.     [name]

## 2014-05-31 NOTE — Discharge Instructions (Signed)
What to eat:  For your first meals, you should eat lightly; only small meals initially.  If you do not have nausea, you may eat larger meals.  Avoid spicy, greasy and heavy food.    General Anesthesia, Adult, Care After  Refer to this sheet in the next few weeks. These instructions provide you with information on caring for yourself after your procedure. Your health care provider may also give you more specific instructions. Your treatment has been planned according to current medical practices, but problems sometimes occur. Call your health care provider if you have any problems or questions after your procedure.  WHAT TO EXPECT AFTER THE PROCEDURE  After the procedure, it is typical to experience:  Sleepiness.  Nausea and vomiting. HOME CARE INSTRUCTIONS  For the first 24 hours after general anesthesia:  Have a responsible person with you.  Do not drive a car. If you are alone, do not take public transportation.  Do not drink alcohol.  Do not take medicine that has not been prescribed by your health care provider.  Do not sign important papers or make important decisions.  You may resume a normal diet and activities as directed by your health care provider.  Change bandages (dressings) as directed.  If you have questions or problems that seem related to general anesthesia, call the hospital and ask for the anesthetist or anesthesiologist on call. SEEK MEDICAL CARE IF:  You have nausea and vomiting that continue the day after anesthesia.  You develop a rash. SEEK IMMEDIATE MEDICAL CARE IF:  You have difficulty breathing.  You have chest pain.  You have any allergic problems. Document Released: 07/28/2000 Document Revised: 12/22/2012 Document Reviewed: 11/04/2012  [facility] Patient Information 2014 [place] [place].    Remove eye patch at 2:30 pm Do not rub eye , Sleep on back or Right side . Avoid heavy lifting bending or straining.

## 2014-05-31 NOTE — Anesthesia Postprocedure Evaluation (Signed)
  Anesthesia Post-op Note  Patient: [name] [name]  Procedure(s) Performed: Procedure(s): CATARACT EXTRACTION PHACO EMOTION AND INTRAOCULAR LENS PLACEMENT (IOC) LEFT EYE (Left)  Patient Location: PACU  Anesthesia Type:MAC  Level of Consciousness: awake and alert   Airway and Oxygen Therapy: Patient Spontanous Breathing  Post-op Pain: none  Post-op Assessment: Post-op Vital signs reviewed and Patient's Cardiovascular Status Stable  Post-op Vital Signs: Reviewed and stable  Last Vitals:  Filed Vitals:   05/31/14 1100  BP:   Pulse: 65  Temp:   Resp:     Complications: No apparent anesthesia complications

## 2014-05-31 NOTE — Op Note (Signed)
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 Following this the patient's face prepped and draped in usual sterile fashion. With the surgeon sitting temporally and the operating microscope in position a Weck-Cel sponges used to fixate the globe and a 15 blade was used to enter through inferior clear cornea Viscoat was injected in the anterior chamber. With an additional Weck-Cel sponge to fixate the globe a 2.75 mm keratome blade was used in a stepwise fashion to enter through clear cornea. A bent 25-gauge needle was used to incise anterior capsule a continuous tear curvilinear capsulorrhexis was formed it was difficult to see all edges of the flap because of the density of the nucleus. Using the Utrata forceps the anterior capsule was removed BSS was then used to hydrodissect and hydrodelineate the nucleus the nucleus was loosened with in the capsular bag with the irrigation tip thick emulsification unit was then used to remove the epinucleus the nucleus was sculpted centrally being very dense the surgeon requested increasing the phaco power to 50% at this point the foot. Working. The phacotip was then removed from the eye the instrument had to be re-prime after re-prime in the instrument and then rechecking the tuning of the phacotip was appropriate. Therefore the phacoemulsification took place again inserting the phacotip sculpting a deep central trough and then using the snapper Hook the nucleus was snapped into 2 quadrants. Using the phacotip to then sculpted 3 additional troughs and troughs were then  divided name the nucleus into 3 pieces. All nuclear fragments were then removed from the eye and the posterior capsule remained intact irrigation-aspiration tip was then used to remove the epinucleus and strip cortical fibers the posterior capsule the posterior capsule remained intact there was adherent posterior capsule fibrosis the polisher was used the fibrosis was not able to be removed but it was peripheral not in the visual axis. Following this Provisc was injected in the anterior chamber intraocular lens implant was examined and noted to have no defects the lens was an Alcon AcrySof IQ lens SN 60 WF 20.0 dpt lens SN number [id number] the lens is placed in the lens injector and injected in the capsular bag it was rotated with a Kuglen hook following this the irrigation-aspiration handpiece was used to remove viscoelastic from the eye Miochol was injected pupil was noted to come down symmetrically round. At this point the eye was pressurized and a single 10-0 nylon suture was placed at the incision to achieve watertight closure. Following this all instruments were removed from the eye topical Tobradex ointment was applied to the eye patch and Fox shield were placed and the patient returned to recovery area in stable condition [name] [name] [name] [name] M.D.

## 2014-05-31 NOTE — H&P (View-Only) (Signed)
History & Physical:   DATE:  05-19-14   NAME:  [name] [name], [name] [name]      [id number]       HISTORY OF PRESENT ILLNESS: Chief Eye Complaints  Cataract: Patient states my glasses doesn't work for me anymore due ot the cataracts. Patient c/o seeing floaters. 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 Jacelyn Pi notes.    HPI: EYES: Reports symptoms of  LOCATION:   BOTH EYES        QUALITY/COURSE:   Reports condition is worsening.        INTENSITY/SEVERITY:    Reports measurement ( or degree) as severe .      DURATION:   Reports the general length of symptoms to be years.                 ACTIVE PROBLEMS: Combined forms of age-related cataract, bilateral   ICD10: H25.813  ICD9:   Onset: 04/05/2014 16:10  Initial Date:     Hypertension   ICD10:   ICD9: 401.9  Onset: 04/05/2014 17:30  Initial Date:  SURGERIES: MEDICATIONS: Ilevro: Strength-  SIG-   Ciloxan (Ciprofloxacin) Solution: 0.3% solution SIG-  1 drop(s)  every 4 hours  REVIEW OF SYSTEMS: ROS:   GEN- Constitutional: HENT: GEN - Endocrine: Reports symptoms of LUNGS/Respiratory:  HEART/Cardiovascular: Reports symptoms of ABD/Gastrointestinal:  Musculoskeletal (BJE): +++++      arthralgias    shoulder pain or problems   NEURO/Neurological: PSYCH/Psychiatric:    Is the pt oriented to time, place, person? YES  Mood depressed __ normal   TOBACCO Smoker Status:   Current every day smoker   ICD10: Z72.0 ICD9: 305.1 Onset: 04/05/2014 16:09    Smoker Status:  SOCIAL HISTORY: Herbalist List - Social History  FAMILY HISTORY:  Family History - 1st Degree Relatives:  Daughter alive and well.   Family History - 1st Degree Relatives:  ALLERGIES: Drug Allergies.  No Known.   Starter - Allergies - Summary:  PHYSICAL EXAMINATION: VS: BMI: 20.0.  BP: 161/102.  H: 65.00 in.  P: 72 /min.  W: 120lbs 0oz.   149/96 @ 5:34  Va     OD [name] [name] 20/400         cc  20/CF OS [name] [name] 20/CF         cc 20/CF OU [name] [name] near 20/400  EYEGLASSES:  OD +2.25 sph  OS: +2.25 +0.25 x142 ADD: +2.50   Unable to get AR  MR 04/05/2014 16:16  OD: +2.25 sph 20/CF OS: +2.75 +0.25 x142 CF ADD: unable   K's OD: 43.75 44.25 OS: 44.00 44.50  VF:  decrease vision in all four quadrants OU  Motility orthophoria and full  PUPILS: 3mm reactive -mg  EYELIDS & OCULAR ADNEXA normal each eye  SLE: Conjunctiva Pinguecula each eye  Cornea Arcus each eye   anterior chamber  deep and quiet each eye  Iris brown each eye  Lens +3 nuclear sclerosis plus to 3 posterior subcapsular opacity  Vitreous Clear os  CCT  Ta   in mmHg    OD   16         OS 16 Time 04/05/2014 16:30   Gonio   Dilation phenyl 2.5% and trop 1%  Fundus:  optic nerve  OD  tilted nerve with pericapillary atrophy 30% cup pink color  OS difficult view secondary to cataract   Macula      OD     CLEAR                                               OS .  Difficulty seeing from cataract  Vessels narrow arterioles  Periphery Normal OD     Exam: GENERAL: Appearance: HEAD, EARS, NOSE AND THROAT: Ears-Nose (external) Inspection: Externally, nose and ears are normal in appearance and without scars, lesions, or nodules.      Hearing assessment shows no problems with normal conversation.      LUNGS and RESPIRATORY: Lung auscultation elicits no wheezing, rhonci, rales or rubs and with equal breath sounds.    Respiratory effort described as breathing is unlabored and chest movement is symmetrical.    HEART (Cardiovascular): Heart auscultation discovers regular rate and rhythm; no murmur, gallop or rub. Normal heart sounds.    ABDOMEN (Gastrointestinal): Mass/Tenderness Exam: Neither are present.     MUSCULOSKELETAL (BJE): Inspection-Palpation: No major bone, joint, tendon, or muscle changes.      NEUROLOGICAL: Alert and oriented. No major  deficits of coordination or sensation.      PSYCHIATRIC: Insight and judgment appear  both to be intact and appropriate.    Mood and affect are described as normal mood and full affect.    SKIN: Skin Inspection: No rashes or lesions  ADMITTING DIAGNOSIS: Combined forms of age-related cataract, bilateral   ICD10: H25.813  ICD9:   Onset: 04/05/2014 16:10    Hypertension   ICD10:   ICD9: 401.9  Onset: 04/05/2014 17:30  Initial Date:  SURGICAL TREATMENT PLAN: clearance from   Dr. Concepcion ElkAvbuere     phaco emulsion cataract extraction with intraocular lens implant left eye  Risk and benefits of surgery have been reviewed with the patient and the patient agrees to proceed with the surgical procedure.   Actions:     Handouts: New Handout, What is a cataract?.    ___________________________ [name], [name] Starter - Inactive Problems:

## 2014-05-31 NOTE — Transfer of Care (Signed)
Immediate Anesthesia Transfer of Care Note  Patient: [name] [name]  Procedure(s) Performed: Procedure(s): CATARACT EXTRACTION PHACO EMOTION AND INTRAOCULAR LENS PLACEMENT (IOC) LEFT EYE (Left)  Patient Location: PACU  Anesthesia Type:MAC  Level of Consciousness: awake, alert  and oriented  Airway & Oxygen Therapy: Patient Spontanous Breathing and Patient connected to nasal cannula oxygen  Post-op Assessment: Report given to PACU RN, Post -op Vital signs reviewed and stable and Patient moving all extremities  Post vital signs: Reviewed and stable  Complications: No apparent anesthesia complications

## 2014-06-01 ENCOUNTER — Encounter (HOSPITAL_COMMUNITY): Payer: Self-pay | Admitting: Ophthalmology

## 2014-06-06 ENCOUNTER — Telehealth: Payer: Self-pay | Admitting: *Deleted

## 2014-06-06 MED ORDER — SODIUM POLYSTYRENE SULFONATE 15 GM/60ML PO SUSP: 15.0000 g | Freq: Once | ORAL | Status: DC

## 2014-06-06 NOTE — Telephone Encounter (Signed)
-----   Message from Ambrose FinlandValerie A Keck, NP sent at 05/19/2014  9:44 AM EST ----- Send Kayexalate 15 grams once to help lower his potassium. Tell him it will make him have slight diarrhea

## 2014-06-06 NOTE — Telephone Encounter (Signed)
Pt aware of lab result [facility]

## 2014-06-12 ENCOUNTER — Ambulatory Visit: Payer: Medicaid Other | Attending: Internal Medicine | Admitting: *Deleted

## 2014-06-12 VITALS — BP 135/80 | HR 74 | Temp 98.1°F | Resp 18

## 2014-06-12 DIAGNOSIS — I10 Essential (primary) hypertension: Secondary | ICD-10-CM | POA: Diagnosis not present

## 2014-06-12 DIAGNOSIS — Z720 Tobacco use: Secondary | ICD-10-CM | POA: Insufficient documentation

## 2014-06-12 NOTE — Patient Instructions (Signed)
DASH Eating Plan °DASH stands for "Dietary Approaches to Stop Hypertension." The DASH eating plan is a healthy eating plan that has been shown to reduce high blood pressure (hypertension). Additional health benefits may include reducing the risk of type 2 diabetes mellitus, heart disease, and stroke. The DASH eating plan may also help with weight loss. °WHAT DO I NEED TO KNOW ABOUT THE DASH EATING PLAN? °For the DASH eating plan, you will follow these general guidelines: °· Choose foods with a percent daily value for sodium of less than 5% (as listed on the food label). °· Use salt-free seasonings or herbs instead of table salt or sea salt. °· Check with your health care provider or pharmacist before using salt substitutes. °· Eat lower-sodium products, often labeled as "lower sodium" or "no salt added." °· Eat fresh foods. °· Eat more vegetables, fruits, and low-fat dairy products. °· Choose whole grains. Look for the word "whole" as the first word in the ingredient list. °· Choose fish and skinless chicken or turkey more often than red meat. Limit fish, poultry, and meat to 6 oz (170 g) each day. °· Limit sweets, desserts, sugars, and sugary drinks. °· Choose heart-healthy fats. °· Limit cheese to 1 oz (28 g) per day. °· Eat more home-cooked food and less restaurant, buffet, and fast food. °· Limit fried foods. °· Cook foods using methods other than frying. °· Limit canned vegetables. If you do use them, rinse them well to decrease the sodium. °· When eating at a restaurant, ask that your food be prepared with less salt, or no salt if possible. °WHAT FOODS CAN I EAT? °Seek help from a dietitian for individual calorie needs. °Grains °Whole grain or whole wheat bread. Brown rice. Whole grain or whole wheat pasta. Quinoa, bulgur, and whole grain cereals. Low-sodium cereals. Corn or whole wheat flour tortillas. Whole grain cornbread. Whole grain crackers. Low-sodium crackers. °Vegetables °Fresh or frozen vegetables  (raw, steamed, roasted, or grilled). Low-sodium or reduced-sodium tomato and vegetable juices. Low-sodium or reduced-sodium tomato sauce and paste. Low-sodium or reduced-sodium canned vegetables.  °Fruits °All fresh, canned (in natural juice), or frozen fruits. °Meat and Other Protein Products °Ground beef (85% or leaner), grass-fed beef, or beef trimmed of fat. Skinless chicken or turkey. Ground chicken or turkey. Pork trimmed of fat. All fish and seafood. Eggs. Dried beans, peas, or lentils. Unsalted nuts and seeds. Unsalted canned beans. °Dairy °Low-fat dairy products, such as skim or 1% milk, 2% or reduced-fat cheeses, low-fat ricotta or cottage cheese, or plain low-fat yogurt. Low-sodium or reduced-sodium cheeses. °Fats and Oils °Tub margarines without trans fats. Light or reduced-fat mayonnaise and salad dressings (reduced sodium). Avocado. Safflower, olive, or canola oils. Natural peanut or almond butter. °Other °Unsalted popcorn and pretzels. °The items listed above may not be a complete list of recommended foods or beverages. Contact your dietitian for more options. °WHAT FOODS ARE NOT RECOMMENDED? °Grains °White bread. White pasta. White rice. Refined cornbread. Bagels and croissants. Crackers that contain trans fat. °Vegetables °Creamed or fried vegetables. Vegetables in a cheese sauce. Regular canned vegetables. Regular canned tomato sauce and paste. Regular tomato and vegetable juices. °Fruits °Dried fruits. Canned fruit in light or heavy syrup. Fruit juice. °Meat and Other Protein Products °Fatty cuts of meat. Ribs, chicken wings, bacon, sausage, bologna, salami, chitterlings, fatback, hot dogs, bratwurst, and packaged luncheon meats. Salted nuts and seeds. Canned beans with salt. °Dairy °Whole or 2% milk, cream, half-and-half, and cream cheese. Whole-fat or sweetened yogurt. Full-fat   cheeses or blue cheese. Nondairy creamers and whipped toppings. Processed cheese, cheese spreads, or cheese  curds. °Condiments °Onion and garlic salt, seasoned salt, table salt, and sea salt. Canned and packaged gravies. Worcestershire sauce. Tartar sauce. Barbecue sauce. Teriyaki sauce. Soy sauce, including reduced sodium. Steak sauce. Fish sauce. Oyster sauce. Cocktail sauce. Horseradish. Ketchup and mustard. Meat flavorings and tenderizers. Bouillon cubes. Hot sauce. Tabasco sauce. Marinades. Taco seasonings. Relishes. °Fats and Oils °Butter, stick margarine, lard, shortening, ghee, and bacon fat. Coconut, palm kernel, or palm oils. Regular salad dressings. °Other °Pickles and olives. Salted popcorn and pretzels. °The items listed above may not be a complete list of foods and beverages to avoid. Contact your dietitian for more information. °WHERE CAN I FIND MORE INFORMATION? °National Heart, Lung, and [employer] Institute: www.nhlbi.nih.gov/health/health-topics/topics/dash/ °Document Released: 04/10/2011 Document Revised: 09/05/2013 Document Reviewed: 02/23/2013 °ExitCare® Patient Information ©2015 [employer]. This information is not intended to replace advice given to you by your health care provider. Make sure you discuss any questions you have with your health care provider. ° °

## 2014-06-12 NOTE — Progress Notes (Signed)
Patient presents for BP check Med list reviewed; states taking all meds as directed Discussed need for low sodium diet and using Mrs. Dash as alternative to salt Discussed walking 30 minutes per day for exercise Patient denies headaches, blurred vision, SHOB, chest pain or presseure Smoking .5 ppd; trying to quit  BP 135/80 P 74 R 18  T 98.1 oral SPO2  100%  Verified with PCP that kayexalate was not to be taken. Patient's potassium level was WNL following 3 days of lasix  Patient advised to call for med refills at least 7 days before running out so as not to go without. Patient aware that he is to f/u with PCP 3 months from last visit (Due 08/1314)

## 2014-07-10 ENCOUNTER — Other Ambulatory Visit: Payer: Self-pay | Admitting: Ophthalmology

## 2014-07-10 ENCOUNTER — Encounter (HOSPITAL_COMMUNITY): Payer: Self-pay

## 2014-07-10 ENCOUNTER — Encounter (HOSPITAL_COMMUNITY)
Admission: RE | Admit: 2014-07-10 | Discharge: 2014-07-10 | Disposition: A | Discharge status: Home / Self Care | Payer: Medicaid Other | Source: Ambulatory Visit | Attending: Ophthalmology | Admitting: Ophthalmology

## 2014-07-10 DIAGNOSIS — Z961 Presence of intraocular lens: Secondary | ICD-10-CM | POA: Diagnosis not present

## 2014-07-10 DIAGNOSIS — F17210 Nicotine dependence, cigarettes, uncomplicated: Secondary | ICD-10-CM | POA: Diagnosis not present

## 2014-07-10 DIAGNOSIS — H25811 Combined forms of age-related cataract, right eye: Secondary | ICD-10-CM | POA: Diagnosis present

## 2014-07-10 DIAGNOSIS — I10 Essential (primary) hypertension: Secondary | ICD-10-CM | POA: Diagnosis not present

## 2014-07-10 LAB — CBC
HCT: 44.3 % (ref 39.0–52.0)
HEMOGLOBIN: 14.2 g/dL (ref 13.0–17.0)
MCH: 24.6 pg — ABNORMAL LOW (ref 26.0–34.0)
MCHC: 32.1 g/dL (ref 30.0–36.0)
MCV: 76.8 fL — ABNORMAL LOW (ref 78.0–100.0)
Platelets: 213 10*3/uL (ref 150–400)
RBC: 5.77 MIL/uL (ref 4.22–5.81)
RDW: 15.3 % (ref 11.5–15.5)
WBC: 9.5 10*3/uL (ref 4.0–10.5)

## 2014-07-10 LAB — BASIC METABOLIC PANEL
ANION GAP: 5 (ref 5–15)
BUN: 15 mg/dL (ref 6–23)
CHLORIDE: 103 mmol/L (ref 96–112)
CO2: 30 mmol/L (ref 19–32)
Calcium: 9.1 mg/dL (ref 8.4–10.5)
Creatinine, Ser: 1.09 mg/dL (ref 0.50–1.35)
GFR calc Af Amer: 87 mL/min — ABNORMAL LOW (ref >90)
GFR calc non Af Amer: 75 mL/min — ABNORMAL LOW (ref >90)
Glucose, Bld: 105 mg/dL — ABNORMAL HIGH (ref 70–99)
POTASSIUM: 3.9 mmol/L (ref 3.5–5.1)
Sodium: 138 mmol/L (ref 135–145)

## 2014-07-10 MED ORDER — TETRACAINE HCL 0.5 % OP SOLN: 1.0000 [drp] | OPHTHALMIC | Status: AC

## 2014-07-10 NOTE — Progress Notes (Signed)
PCP: Tyson DenseValerie Keck,NP

## 2014-07-10 NOTE — Progress Notes (Signed)
Spoke to [place] in Dr. Hosie PoissonWhitaker's office, orders for consent and any pre-op med. Not in epic.

## 2014-07-10 NOTE — H&P (Signed)
                  History & Physical:   DATE:   07-04-14  NAME:  [name] [name], [name] [name]      [id number]       HISTORY OF PRESENT ILLNESS: Chief Eye Complaints  Va is  blurry OD per pt.   Cataract:  3 WEEKS PO CE w IOL OS.  HPI: EYES: Reports symptoms of PROBLEMS DRIVING & WATCHING TV.     LOCATION:   BOTH EYES        QUALITY/COURSE:   Reports condition is worsening.        INTENSITY/SEVERITY:    Reports measurement ( or degree) as severe .      DURATION:   Reports the general length of symptoms to be years.     ACTIVE PROBLEMS: Pseudophakia   ICD10: Z96.1 ICD9: 446.0 Onset: 06/01/2014 13:57   Combined forms of age-related cataract, right eye   ICD10: H25.811  ICD9:   Onset: 04/05/2014 16:10    Hypertension   ICD10:   ICD9: 401.9  Onset: 04/05/2014 17:30  Initial Date:  SURGERIES: 05/31/14  phaco emulsion cataract extraction   w/ IOL OS Pick List - Surgeries  MEDICATIONS: Amlodipine (Norvasc):   5 mg tablet  SIG-  1 each   once a day   Ilevro: Strength-  SIG-    Ciloxan (Ciprofloxacin) Solution: 0.3% solution SIG-  2 drop(s)  every 4 hours  REVIEW OF SYSTEMS: ROS:   GEN- Constitutional: HENT: GEN - Endocrine: Reports symptoms of LUNGS/Respiratory:  HEART/Cardiovascular: Reports symptoms of ABD/Gastrointestinal:  Musculoskeletal (BJE):  arthralgias    shoulder pain or problems   NEURO/Neurological: PSYCH/Psychiatric:    Is the pt oriented to time, place, person? YES  Mood normal    TOBACCO: Tobacco Use Cessation IVNTJ Counseling.   #4000F  Relates Dxs-  Modifiers-  Place of Service:        Smoker Status:   Current every day smoker   ICD10: Z72.0 ICD9: 305.1 Onset: 04/05/2014 16:09    SOCIAL HISTORY: noncontributory  FAMILY HISTORY: Family History - 1st Degree Relatives:  Daughter alive and well.    ALLERGIES: Drug Allergies.  No Known.     PHYSICAL EXAMINATION: VS: BMI: 20.0.  BP: 161/102.  H: 65.00 in.  P: 72 /min.  W: 120lbs 0oz.   149/96 @ 5:34  Va     OD [place] 20/400          cc 20/CF OS [place] 20/CF         cc 20/CF OU [place] near 20/400  EYEGLASSES:  OD +2.25 sph  OS: +2.25 +0.25 x142 ADD: +2.50   Unable to get AR  MR 04/05/2014 16:16  OD: +2.25 sph 20/CF OS: +2.75 +0.25 x142 CF ADD: unable   K's OD: 43.75 44.25 OS: 44.00 44.50  VF:  decrease vision in all four quadrants OU  Motility orthophoria and full  PUPILS: 3mm reactive -mg  EYELIDS & OCULAR ADNEXA normal each eye  SLE: Conjunctiva Pinguecula each eye  Cornea Arcus each eye   anterior chamber  deep and quiet each eye  Iris brown each eye  Lens +3 nuclear sclerosis plus to 3 posterior subcapsular opacity  Vitreous Clear os  CCT  Ta   in mmHg    OD   16         OS 16 Time 04/05/2014 16:30   Dilation phenyl 2.5% and trop 1%  Fundus: optic nerve    OD  tilted nerve with pericapillary atrophy 30% cup pink color                                                                   OS difficult view secondary to cataract   Macula      OD     CLEAR                                               OS .  Difficulty seeing from cataract  Vessels narrow arterioles  Periphery Normal OD     Exam: GENERAL: Appearance: HEAD, EARS, NOSE AND THROAT: Ears-Nose (external) Inspection: Externally, nose and ears are normal in appearance and without scars, lesions, or nodules.      Hearing assessment shows no problems with normal conversation.      LUNGS and RESPIRATORY: Lung auscultation elicits no wheezing, rhonci, rales or rubs and with equal breath sounds.    Respiratory effort described as breathing is unlabored and chest movement is symmetrical.    HEART (Cardiovascular): Heart auscultation discovers regular rate and rhythm; no murmur, gallop or rub. Normal heart sounds.    ABDOMEN (Gastrointestinal): Mass/Tenderness Exam: Neither are present.     MUSCULOSKELETAL (BJE): Inspection-Palpation: No major bone, joint, tendon, or muscle changes.      NEUROLOGICAL: Alert and oriented. No major  deficits of coordination or sensation.      PSYCHIATRIC: Insight and judgment appear  both to be intact and appropriate.    Mood and affect are described as normal mood and full affect.    SKIN: Skin Inspection: No rashes or lesions  ADMITTING DIAGNOSIS: Combined forms of age-related cataract, right eye   ICD10: H25.811  ICD9:   Onset: 04/05/2014 16:10 stable post OP  phaco emulsion cataract extraction  w  intraocular lens implant  OS  Pseudophakia   ICD10: Z96.1 ICD9: 446.0 Onset: 06/01/2014 13:56   Hypertension   ICD10:   ICD9: 401.9  Onset: 04/05/2014 17:30  Initial Date:  SURGICAL TREATMENT PLAN: phaco emulsion cataract extraction  w  intraocular lens implant  OD   Risk and benefits of surgery have been reviewed with the patient and the patient agrees to proceed with the surgical procedure.            ___________________________ Naszir Cott, Jr. Starter - Inactive Problems:  

## 2014-07-10 NOTE — Pre-Procedure Instructions (Signed)
[name] [name]  07/10/2014   Your procedure is scheduled on:  Wednesday, March 9  [facility] at 10:30 AM.  Call this number if you have problems the morning of surgery: [phone]              If any questions prior to surgery call pre-admission [phone] (Monday-Friday 8:00am -4:00pm)   Remember:   Do not eat food or drink liquids after midnight.   Take these medicines the morning of surgery with A SIP OF WATER: tylenol if needed, amlodipine (norvasc)             Stop aleve today.   Do not wear jewelry, make-up or nail polish.  Do not wear lotions, powders, or perfumes. You may wear deodorant.  Do not shave 48 hours prior to surgery. Men may shave face and neck.  Do not bring valuables to the hospital.  [facility] is not responsible                  for any belongings or valuables.               Contacts, dentures or bridgework may not be worn into surgery.  Leave suitcase in the car. After surgery it may be brought to your room.  For patients admitted to the hospital, discharge time is determined by your                treatment team.               Patients discharged the day of surgery will not be allowed to drive  home.  Name and phone number of your driver:   Special Instructions: review preparing for surgery handout   Please read over the following fact sheets that you were given: Pain Booklet, Coughing and Deep Breathing and Surgical Site Infection Prevention

## 2014-07-12 ENCOUNTER — Encounter (HOSPITAL_COMMUNITY)
Admission: RE | Disposition: A | Discharge status: Home / Self Care | Payer: Self-pay | Source: Ambulatory Visit | Attending: Ophthalmology

## 2014-07-12 ENCOUNTER — Ambulatory Visit (HOSPITAL_COMMUNITY): Payer: Medicaid Other | Admitting: Certified Registered Nurse Anesthetist

## 2014-07-12 ENCOUNTER — Ambulatory Visit (HOSPITAL_COMMUNITY)
Admission: RE | Admit: 2014-07-12 | Discharge: 2014-07-12 | Disposition: A | Discharge status: Home / Self Care | Payer: Medicaid Other | Source: Ambulatory Visit | Attending: Ophthalmology | Admitting: Ophthalmology

## 2014-07-12 ENCOUNTER — Encounter (HOSPITAL_COMMUNITY): Payer: Self-pay | Admitting: *Deleted

## 2014-07-12 DIAGNOSIS — I10 Essential (primary) hypertension: Secondary | ICD-10-CM | POA: Diagnosis not present

## 2014-07-12 DIAGNOSIS — F17210 Nicotine dependence, cigarettes, uncomplicated: Secondary | ICD-10-CM | POA: Diagnosis not present

## 2014-07-12 DIAGNOSIS — H25811 Combined forms of age-related cataract, right eye: Secondary | ICD-10-CM | POA: Diagnosis not present

## 2014-07-12 DIAGNOSIS — Z961 Presence of intraocular lens: Secondary | ICD-10-CM | POA: Diagnosis not present

## 2014-07-12 HISTORY — PX: CATARACT EXTRACTION W/PHACO: SHX586

## 2014-07-12 SURGERY — PHACOEMULSIFICATION, CATARACT, WITH IOL INSERTION
Anesthesia: Monitor Anesthesia Care | Site: Eye | Laterality: Right

## 2014-07-12 MED ORDER — MIDAZOLAM HCL 2 MG/2ML IJ SOLN
INTRAMUSCULAR | Status: AC
  Filled 2014-07-12: qty 2

## 2014-07-12 MED ORDER — DEXAMETHASONE SODIUM PHOSPHATE 10 MG/ML IJ SOLN
INTRAMUSCULAR | Status: AC
  Filled 2014-07-12: qty 1

## 2014-07-12 MED ORDER — GATIFLOXACIN 0.5 % OP SOLN
1.0000 [drp] | OPHTHALMIC | Status: AC | PRN
  Administered 2014-07-12 (×3): 1 [drp] via OPHTHALMIC
  Filled 2014-07-12: qty 2.5

## 2014-07-12 MED ORDER — LIDOCAINE-EPINEPHRINE 2 %-1:100000 IJ SOLN
INTRAMUSCULAR | Status: DC | PRN
  Administered 2014-07-12: 5 mL via RETROBULBAR

## 2014-07-12 MED ORDER — 0.9 % SODIUM CHLORIDE (POUR BTL) OPTIME
TOPICAL | Status: DC | PRN
  Administered 2014-07-12: 1000 mL

## 2014-07-12 MED ORDER — MIDAZOLAM HCL 5 MG/5ML IJ SOLN
INTRAMUSCULAR | Status: DC | PRN
  Administered 2014-07-12: 2 mg via INTRAVENOUS

## 2014-07-12 MED ORDER — TETRACAINE HCL 0.5 % OP SOLN
OPHTHALMIC | Status: AC
  Filled 2014-07-12: qty 2

## 2014-07-12 MED ORDER — TROPICAMIDE 1 % OP SOLN
1.0000 [drp] | OPHTHALMIC | Status: AC
  Administered 2014-07-12 (×3): 1 [drp] via OPHTHALMIC
  Filled 2014-07-12: qty 3

## 2014-07-12 MED ORDER — NA CHONDROIT SULF-NA HYALURON 40-30 MG/ML IO SOLN
INTRAOCULAR | Status: AC
  Filled 2014-07-12: qty 0.5

## 2014-07-12 MED ORDER — FENTANYL CITRATE 0.05 MG/ML IJ SOLN
INTRAMUSCULAR | Status: DC | PRN
  Administered 2014-07-12: 100 ug via INTRAVENOUS

## 2014-07-12 MED ORDER — HYALURONIDASE HUMAN 150 UNIT/ML IJ SOLN
INTRAMUSCULAR | Status: AC
  Filled 2014-07-12: qty 1

## 2014-07-12 MED ORDER — ACETYLCHOLINE CHLORIDE 1:100 IO SOLR
INTRAOCULAR | Status: DC | PRN
  Administered 2014-07-12: 10 mg via INTRAOCULAR

## 2014-07-12 MED ORDER — PROPOFOL 10 MG/ML IV BOLUS
INTRAVENOUS | Status: DC | PRN
  Administered 2014-07-12: 30 mg via INTRAVENOUS

## 2014-07-12 MED ORDER — PHENYLEPHRINE HCL 2.5 % OP SOLN
1.0000 [drp] | OPHTHALMIC | Status: AC
  Administered 2014-07-12 (×3): 1 [drp] via OPHTHALMIC
  Filled 2014-07-12: qty 2

## 2014-07-12 MED ORDER — FENTANYL CITRATE 0.05 MG/ML IJ SOLN
INTRAMUSCULAR | Status: AC
  Filled 2014-07-12: qty 5

## 2014-07-12 MED ORDER — KETOROLAC TROMETHAMINE 0.5 % OP SOLN
1.0000 [drp] | OPHTHALMIC | Status: AC
  Administered 2014-07-12 (×3): 1 [drp] via OPHTHALMIC
  Filled 2014-07-12: qty 5

## 2014-07-12 MED ORDER — BSS IO SOLN
INTRAOCULAR | Status: AC
  Filled 2014-07-12: qty 15

## 2014-07-12 MED ORDER — SODIUM HYALURONATE 10 MG/ML IO SOLN
INTRAOCULAR | Status: AC
  Filled 2014-07-12: qty 0.85

## 2014-07-12 MED ORDER — ACETYLCHOLINE CHLORIDE 1:100 IO SOLR
INTRAOCULAR | Status: AC
  Filled 2014-07-12: qty 1

## 2014-07-12 MED ORDER — LIDOCAINE HCL 2 % IJ SOLN
INTRAMUSCULAR | Status: AC
  Filled 2014-07-12: qty 20

## 2014-07-12 MED ORDER — CYCLOPENTOLATE HCL 1 % OP SOLN
1.0000 [drp] | OPHTHALMIC | Status: AC
  Administered 2014-07-12 (×3): 1 [drp] via OPHTHALMIC
  Filled 2014-07-12: qty 2

## 2014-07-12 MED ORDER — BUPIVACAINE HCL (PF) 0.75 % IJ SOLN
INTRAMUSCULAR | Status: AC
  Filled 2014-07-12: qty 10

## 2014-07-12 MED ORDER — TETRACAINE HCL 0.5 % OP SOLN
OPHTHALMIC | Status: DC | PRN
  Administered 2014-07-12: 1 [drp] via OPHTHALMIC

## 2014-07-12 MED ORDER — SODIUM CHLORIDE 0.9 % IV SOLN
INTRAVENOUS | Status: DC
  Administered 2014-07-12: 11:00:00 via INTRAVENOUS

## 2014-07-12 MED ORDER — EPINEPHRINE HCL 1 MG/ML IJ SOLN
INTRAMUSCULAR | Status: AC
  Filled 2014-07-12: qty 1

## 2014-07-12 MED ORDER — SODIUM CHLORIDE 0.9 % IV SOLN
INTRAVENOUS | Status: DC | PRN
  Administered 2014-07-12 (×2): via INTRAVENOUS

## 2014-07-12 MED ORDER — GENTAMICIN SULFATE 40 MG/ML IJ SOLN
INTRAMUSCULAR | Status: AC
  Filled 2014-07-12: qty 2

## 2014-07-12 MED ORDER — TOBRAMYCIN 0.3 % OP OINT
TOPICAL_OINTMENT | OPHTHALMIC | Status: DC | PRN
  Administered 2014-07-12: 1 via OPHTHALMIC

## 2014-07-12 MED ORDER — NA CHONDROIT SULF-NA HYALURON 40-30 MG/ML IO SOLN
INTRAOCULAR | Status: DC | PRN
  Administered 2014-07-12: 0.5 mL via INTRAOCULAR

## 2014-07-12 MED ORDER — TOBRAMYCIN-DEXAMETHASONE 0.3-0.1 % OP OINT
TOPICAL_OINTMENT | OPHTHALMIC | Status: AC
  Filled 2014-07-12: qty 3.5

## 2014-07-12 MED ORDER — EPINEPHRINE HCL 1 MG/ML IJ SOLN
INTRAOCULAR | Status: DC | PRN
  Administered 2014-07-12: 12:00:00

## 2014-07-12 MED ORDER — PHENYLEPHRINE HCL 10 MG/ML IJ SOLN
INTRAMUSCULAR | Status: DC | PRN
  Administered 2014-07-12: 60 ug via INTRAVENOUS

## 2014-07-12 MED ORDER — LIDOCAINE-EPINEPHRINE 2 %-1:100000 IJ SOLN
INTRAMUSCULAR | Status: AC
  Filled 2014-07-12: qty 1

## 2014-07-12 MED ORDER — SODIUM HYALURONATE 10 MG/ML IO SOLN
INTRAOCULAR | Status: DC | PRN
  Administered 2014-07-12: 0.85 mL via INTRAOCULAR

## 2014-07-12 MED ORDER — BSS IO SOLN
INTRAOCULAR | Status: DC | PRN
  Administered 2014-07-12: 15 mL via INTRAOCULAR

## 2014-07-12 SURGICAL SUPPLY — 37 items
APL SRG 3 HI ABS STRL LF PLS (MISCELLANEOUS) ×1
APPLICATOR COTTON TIP 6IN STRL (MISCELLANEOUS) ×3 IMPLANT
APPLICATOR DR MATTHEWS STRL (MISCELLANEOUS) ×3 IMPLANT
BLADE KERATOME 2.75 (BLADE) ×2 IMPLANT
BLADE KERATOME 2.75MM (BLADE) ×1
CANNULA ANTERIOR CHAMBER 27GA (MISCELLANEOUS) ×3 IMPLANT
CORDS BIPOLAR (ELECTRODE) IMPLANT
COVER MAYO STAND STRL (DRAPES) ×3 IMPLANT
DRAPE OPHTHALMIC 40X48 W POUCH (DRAPES) ×3 IMPLANT
DRAPE RETRACTOR (MISCELLANEOUS) ×3 IMPLANT
GLOVE BIO SURGEON STRL SZ7.5 (GLOVE) ×2 IMPLANT
GLOVE BIO SURGEON STRL SZ8 (GLOVE) ×3 IMPLANT
GLOVE SURG SS PI 7.0 STRL IVOR (GLOVE) ×2 IMPLANT
GOWN STRL REUS W/ TWL LRG LVL3 (GOWN DISPOSABLE) ×2 IMPLANT
GOWN STRL REUS W/TWL LRG LVL3 (GOWN DISPOSABLE) ×6
KIT BASIN OR (CUSTOM PROCEDURE TRAY) ×3 IMPLANT
KIT ROOM TURNOVER OR (KITS) ×3 IMPLANT
LENS IOL ACRSF IQ PC 20.0 (Intraocular Lens) IMPLANT
LENS IOL ACRYSOF IQ POST 20.0 (Intraocular Lens) ×3 IMPLANT
NDL 18GX1X1/2 (RX/OR ONLY) (NEEDLE) ×1 IMPLANT
NDL 25GX 5/8IN NON SAFETY (NEEDLE) ×1 IMPLANT
NDL FILTER BLUNT 18X1 1/2 (NEEDLE) ×1 IMPLANT
NEEDLE 18GX1X1/2 (RX/OR ONLY) (NEEDLE) ×3 IMPLANT
NEEDLE 25GX 5/8IN NON SAFETY (NEEDLE) ×3 IMPLANT
NEEDLE FILTER BLUNT 18X 1/2SAF (NEEDLE) ×2
NEEDLE FILTER BLUNT 18X1 1/2 (NEEDLE) ×1 IMPLANT
NS IRRIG 1000ML POUR BTL (IV SOLUTION) ×3 IMPLANT
PACK CATARACT CUSTOM (CUSTOM PROCEDURE TRAY) ×3 IMPLANT
PAD ARMBOARD 7.5X6 YLW CONV (MISCELLANEOUS) ×3 IMPLANT
PAK PIK CVS CATARACT (OPHTHALMIC) ×3 IMPLANT
SUT ETHILON 10 0 CS140 6 (SUTURE) IMPLANT
SUT SILK 6 0 G 6 (SUTURE) IMPLANT
SYR TB 1ML LUER SLIP (SYRINGE) ×3 IMPLANT
TIP ABS 45DEG FLARED 0.9MM (TIP) ×3 IMPLANT
TOWEL OR 17X26 10 PK STRL BLUE (TOWEL DISPOSABLE) ×3 IMPLANT
WATER STERILE IRR 1000ML POUR (IV SOLUTION) ×3 IMPLANT
WIPE INSTRUMENT VISIWIPE 73X73 (MISCELLANEOUS) ×3 IMPLANT

## 2014-07-12 NOTE — Transfer of Care (Signed)
Immediate Anesthesia Transfer of Care Note  Patient: [name] [name]  Procedure(s) Performed: Procedure(s): CATARACT EXTRACTION PHACO AND INTRAOCULAR LENS PLACEMENT (IOC) (Right)  Patient Location: PACU  Anesthesia Type:MAC  Level of Consciousness: awake, alert  and oriented  Airway & Oxygen Therapy: Patient Spontanous Breathing and Patient connected to nasal cannula oxygen  Post-op Assessment: Report given to RN and Post -op Vital signs reviewed and stable  Post vital signs: Reviewed and stable  Last Vitals:  Filed Vitals:   07/12/14 1044  BP: 126/71  Pulse: 77  Temp: 36.5 C  Resp: 18    Complications: No apparent anesthesia complications

## 2014-07-12 NOTE — Op Note (Signed)
Preoperative diagnosis: Visually significant cataract right eye Postoperative diagnosis: Same Procedure: Phacoemulsification with intraocular lens implant Anesthesia: 2% Xylocaine with epinephrine in a 50-50 mixture 0.75% Marcaine with an ampule of Wydase Complications: None Procedure: The patient was transported to the operating room where he was given a peribulbar block with the aforementioned local anesthetic agent. With the surgeon sitting temporally and the operating microscope in position a Weck-Cel sponges used to fixate the globe and a 15 blade was used to enter the anterior chamber through superior clear cornea. Viscoat was injected into the anterior chamber using an additional Weck-Cel sponge to fixate the globe a 3.5 mm keratome blade was used in a stepwise fashion through temporal cornea. A bent 25-gauge needle was used to incise anterior capsule and a continuous tear curvilinear capsulorrhexis was formed. The capsule forceps were used to remove the anterior capsule BSS was used to hydrodissect and hydrodelineate the nucleus indicating a Gold ring around the nucleus. The nucleus was loosened in the capsular bag with the cannula. The phacoemulsification unit was then used to sculpt the nucleus the nucleus was very firm necessitating a fake oh power of 50% and she'll sculpting took place then using the snapper hook and the phacotip the nucleus was divided into 4 quadrants all nuclear quadrants were removed from the eye and the posterior capsule remained intact. Following this the irrigation-aspiration device was then used to strip cortical fibers from the posterior capsule after all cortical fibers had been removed the posterior capsule was polished. Provisc was injected into the anterior chamber and the intraocular lens implant was examined and noted to have no defects the lens was an Alcon AcrySof SN 60 WF IQ lens 20.0  diopter  power SN number [id number] the lens is placed in the lens injector  and then injected in the capsular bag and positioned with a Kuglen hook. the irrigation-aspiration handpiece was then used to remove the viscoelastic from the eye the lens was centered a single 10-0 nylon suture was placed to achieve watertight closure after the eye was pressurized. There being no leakage all instruments were removed from the eye and topical Tobradex ointment was applied to the eye. A patch and Fox U were placed and the patient returned to recovery area in stable condition [name] [name] M.D.  

## 2014-07-12 NOTE — Anesthesia Postprocedure Evaluation (Signed)
  Anesthesia Post-op Note  Patient: [name] [name]  Procedure(s) Performed: Procedure(s): CATARACT EXTRACTION PHACO AND INTRAOCULAR LENS PLACEMENT (IOC) (Right)  Patient Location: PACU  Anesthesia Type:MAC  Level of Consciousness: awake  Airway and Oxygen Therapy: Patient Spontanous Breathing  Post-op Pain: mild  Post-op Assessment: Post-op Vital signs reviewed  Post-op Vital Signs: Reviewed  Last Vitals:  Filed Vitals:   07/12/14 1330  BP: 126/66  Pulse: 60  Temp:   Resp: 13    Complications: No apparent anesthesia complications

## 2014-07-12 NOTE — Discharge Instructions (Signed)
Sleep on your back or side opposite to the surgery eye. Do not apply pressure to the eye avoid heavy lifting bending or straining. The eye patch may be removed at 4:30 today. Applied the eyedrops given to the patient at doctor's office at 4:30 today and also again in the morning. May take extra-strength Tylenol every 4-6 hours as needed for pain.

## 2014-07-12 NOTE — Interval H&P Note (Signed)
History and Physical Interval Note:  07/12/2014 12:01 PM  [name] [name]  has presented today for surgery, with the diagnosis of COMBINED FORMS OF AGE RELATED CATARACT  RIGHT EYE  The various methods of treatment have been discussed with the patient and family. After consideration of risks, benefits and other options for treatment, the patient has consented to  Procedure(s): CATARACT EXTRACTION PHACO AND INTRAOCULAR LENS PLACEMENT (IOC) (Right) as a surgical intervention .  The patient's history has been reviewed, patient examined, no change in status, stable for surgery.  I have reviewed the patient's chart and labs.  Questions were answered to the patient's satisfaction.     Damareon Lanni

## 2014-07-12 NOTE — Op Note (Signed)
Preoperative diagnosis: Visually significant cataract right eye Postoperative diagnosis: Same Procedure: Phacoemulsification with intraocular lens implant Anesthesia: 2% Xylocaine with epinephrine in a 50-50 mixture 0.75% Marcaine with an ampule of Wydase Complications: None Procedure: The patient was transported to the operating room where he was given a peribulbar block with the aforementioned local anesthetic agent. With the surgeon sitting temporally and the operating microscope in position a Weck-Cel sponges used to fixate the globe and a 15 blade was used to enter the anterior chamber through superior clear cornea. Viscoat was injected into the anterior chamber using an additional Weck-Cel sponge to fixate the globe a 3.5 mm keratome blade was used in a stepwise fashion through temporal cornea. A bent 25-gauge needle was used to incise anterior capsule and a continuous tear curvilinear capsulorrhexis was formed. The capsule forceps were used to remove the anterior capsule BSS was used to hydrodissect and hydrodelineate the nucleus indicating a Gold ring around the nucleus. The nucleus was loosened in the capsular bag with the cannula. The phacoemulsification unit was then used to sculpt the nucleus the nucleus was very firm necessitating a fake oh power of 50% and she'll sculpting took place then using the snapper hook and the phacotip the nucleus was divided into 4 quadrants all nuclear quadrants were removed from the eye and the posterior capsule remained intact. Following this the irrigation-aspiration device was then used to strip cortical fibers from the posterior capsule after all cortical fibers had been removed the posterior capsule was polished. Provisc was injected into the anterior chamber and the intraocular lens implant was examined and noted to have no defects the lens was an Alcon AcrySof SN 60 WF IQ lens 20.0  diopter  power SN number [id number] the lens is placed in the lens injector  and then injected in the capsular bag and positioned with a Kuglen hook. the irrigation-aspiration handpiece was then used to remove the viscoelastic from the eye the lens was centered a single 10-0 nylon suture was placed to achieve watertight closure after the eye was pressurized. There being no leakage all instruments were removed from the eye and topical Tobradex ointment was applied to the eye. A patch and Fox U were placed and the patient returned to recovery area in stable condition Chalmers Guestoy [place] Surges Junior M.D.

## 2014-07-12 NOTE — Anesthesia Preprocedure Evaluation (Signed)
Anesthesia Evaluation  Patient identified by MRN, date of birth, ID band Patient awake    Airway        Dental   Pulmonary Current Smoker,          Cardiovascular hypertension,     Neuro/Psych  Headaches,    GI/Hepatic   Endo/Other    Renal/GU      Musculoskeletal   Abdominal   Peds  Hematology   Anesthesia Other Findings   Reproductive/Obstetrics                             Anesthesia Physical Anesthesia Plan  ASA: II  Anesthesia Plan: General   Post-op Pain Management:    Induction:   Airway Management Planned:   Additional Equipment:   Intra-op Plan:   Post-operative Plan:   Informed Consent:   Plan Discussed with:   Anesthesia Plan Comments:         Anesthesia Quick Evaluation

## 2014-07-12 NOTE — H&P (View-Only) (Signed)
History & Physical:   DATE:   07-04-14  NAMIdelle Jo:  [name], [name] [name]      [phone]       HISTORY OF PRESENT ILLNESS: Chief Eye Complaints  Va is  blurry OD per pt.   Cataract:  3 WEEKS PO CE w IOL OS.  HPI: EYES: Reports symptoms of PROBLEMS DRIVING & WATCHING TV.     LOCATION:   BOTH EYES        QUALITY/COURSE:   Reports condition is worsening.        INTENSITY/SEVERITY:    Reports measurement ( or degree) as severe .      DURATION:   Reports the general length of symptoms to be years.     ACTIVE PROBLEMS: Pseudophakia   ICD10: Z96.1 ICD9: 446.0 Onset: 06/01/2014 13:57   Combined forms of age-related cataract, right eye   ICD10: H25.811  ICD9:   Onset: 04/05/2014 16:10    Hypertension   ICD10:   ICD9: 401.9  Onset: 04/05/2014 17:30  Initial Date:  SURGERIES: 05/31/14  phaco emulsion cataract extraction   w/ IOL OS Pick List - Surgeries  MEDICATIONS: Amlodipine (Norvasc):   5 mg tablet  SIG-  1 each   once a day   Ilevro: Strength-  SIG-    Ciloxan (Ciprofloxacin) Solution: 0.3% solution SIG-  2 drop(s)  every 4 hours  REVIEW OF SYSTEMS: ROS:   GEN- Constitutional: HENT: GEN - Endocrine: Reports symptoms of LUNGS/Respiratory:  HEART/Cardiovascular: Reports symptoms of ABD/Gastrointestinal:  Musculoskeletal (BJE):  arthralgias    shoulder pain or problems   NEURO/Neurological: PSYCH/Psychiatric:    Is the pt oriented to time, place, person? YES  Mood normal    TOBACCO: Tobacco Use Cessation IVNTJ Counseling.   #4000F  Relates Dxs-  Modifiers-  Place of Service:        Smoker Status:   Current every day smoker   ICD10: Z72.0 ICD9: 305.1 Onset: 04/05/2014 16:09    SOCIAL HISTORY: noncontributory  FAMILY HISTORY: Family History - 1st Degree Relatives:  Daughter alive and well.    ALLERGIES: Drug Allergies.  No Known.     PHYSICAL EXAMINATION: VS: BMI: 20.0.  BP: 161/102.  H: 65.00 in.  P: 72 /min.  W: 120lbs 0oz.   149/96 @ 5:34  Va     OD [name]          cc 20/CF OS [name]         cc 20/CF OU [name]  EYEGLASSES:  OD +2.25 sph  OS: +2.25 +0.25 x142 ADD: +2.50   Unable to get AR  MR 04/05/2014 16:16  OD: +2.25 sph 20/CF OS: +2.75 +0.25 x142 CF ADD: unable   K's OD: 43.75 44.25 OS: 44.00 44.50  VF:  decrease vision in all four quadrants OU  Motility orthophoria and full  PUPILS: 3mm reactive -mg  EYELIDS & OCULAR ADNEXA normal each eye  SLE: Conjunctiva Pinguecula each eye  Cornea Arcus each eye   anterior chamber  deep and quiet each eye  Iris brown each eye  Lens +3 nuclear sclerosis plus to 3 posterior subcapsular opacity  Vitreous Clear os  CCT  Ta   in mmHg    OD   16         OS 16 Time 04/05/2014 16:30   Dilation phenyl 2.5% and trop 1%  Fundus: optic nerve  OD  tilted nerve with pericapillary atrophy 30% cup pink color                                                                   OS difficult view secondary to cataract   Macula      OD     CLEAR                                               OS .  Difficulty seeing from cataract  Vessels narrow arterioles  Periphery Normal OD     Exam: GENERAL: Appearance: HEAD, EARS, NOSE AND THROAT: Ears-Nose (external) Inspection: Externally, nose and ears are normal in appearance and without scars, lesions, or nodules.      Hearing assessment shows no problems with normal conversation.      LUNGS and RESPIRATORY: Lung auscultation elicits no wheezing, rhonci, rales or rubs and with equal breath sounds.    Respiratory effort described as breathing is unlabored and chest movement is symmetrical.    HEART (Cardiovascular): Heart auscultation discovers regular rate and rhythm; no murmur, gallop or rub. Normal heart sounds.    ABDOMEN (Gastrointestinal): Mass/Tenderness Exam: Neither are present.     MUSCULOSKELETAL (BJE): Inspection-Palpation: No major bone, joint, tendon, or muscle changes.      NEUROLOGICAL: Alert and oriented. No major  deficits of coordination or sensation.      PSYCHIATRIC: Insight and judgment appear  both to be intact and appropriate.    Mood and affect are described as normal mood and full affect.    SKIN: Skin Inspection: No rashes or lesions  ADMITTING DIAGNOSIS: Combined forms of age-related cataract, right eye   ICD10: H25.811  ICD9:   Onset: 04/05/2014 16:10 stable post OP  phaco emulsion cataract extraction  w  intraocular lens implant  OS  Pseudophakia   ICD10: Z96.1 ICD9: 446.0 Onset: 06/01/2014 13:56   Hypertension   ICD10:   ICD9: 401.9  Onset: 04/05/2014 17:30  Initial Date:  SURGICAL TREATMENT PLAN: phaco emulsion cataract extraction  w  intraocular lens implant  OD   Risk and benefits of surgery have been reviewed with the patient and the patient agrees to proceed with the surgical procedure.            ___________________________ Chalmers Guest, Tawni Millers - Inactive Problems:

## 2014-07-13 ENCOUNTER — Encounter (HOSPITAL_COMMUNITY): Payer: Self-pay | Admitting: Ophthalmology

## 2014-08-14 ENCOUNTER — Ambulatory Visit: Payer: Medicaid Other | Admitting: Internal Medicine

## 2014-08-16 ENCOUNTER — Encounter: Payer: Self-pay | Admitting: Internal Medicine

## 2014-08-16 ENCOUNTER — Ambulatory Visit: Payer: Medicaid Other | Attending: Internal Medicine | Admitting: Internal Medicine

## 2014-08-16 VITALS — BP 137/77 | HR 77 | Temp 97.4°F | Resp 16 | Ht 63.0 in | Wt 123.0 lb

## 2014-08-16 DIAGNOSIS — K029 Dental caries, unspecified: Secondary | ICD-10-CM | POA: Diagnosis not present

## 2014-08-16 DIAGNOSIS — I10 Essential (primary) hypertension: Secondary | ICD-10-CM | POA: Insufficient documentation

## 2014-08-16 DIAGNOSIS — K047 Periapical abscess without sinus: Secondary | ICD-10-CM | POA: Insufficient documentation

## 2014-08-16 DIAGNOSIS — R220 Localized swelling, mass and lump, head: Secondary | ICD-10-CM

## 2014-08-16 DIAGNOSIS — F17200 Nicotine dependence, unspecified, uncomplicated: Secondary | ICD-10-CM | POA: Diagnosis not present

## 2014-08-16 MED ORDER — AMLODIPINE BESYLATE 10 MG PO TABS: 10.0000 mg | ORAL_TABLET | Freq: Every day | ORAL | Status: DC

## 2014-08-16 MED ORDER — TRAMADOL HCL 50 MG PO TABS: 50.0000 mg | ORAL_TABLET | Freq: Three times a day (TID) | ORAL | Status: DC | PRN

## 2014-08-16 MED ORDER — AMOXICILLIN 500 MG PO CAPS: 500.0000 mg | ORAL_CAPSULE | Freq: Three times a day (TID) | ORAL | Status: DC

## 2014-08-16 NOTE — Progress Notes (Signed)
Patient ID: [name] [name], male   DOB: Sep 20, 1960, 54 y.o.   MRN: [id number]  CC: HTN, teeth pain   HPI: [name] [name] is a 54 y.o. male here today for a follow up visit.  Patient has past medical history of hypertension. He reports that he has been doing well with the amlodipine and feels much better since beginning medication. He has noticed improvement in headaches.  Pain in bilateral upper sides of mouth for 2-3 months. It is beginning to get difficult for him to eat. He has never been to the dentist. He has been using home remedies for pain.   He reports that he has been having chills. He often is very cold but then gets hot easily.  He denies weight loss. He is not anemic or have history of thyroid disorder  No Known Allergies Past Medical History  Diagnosis Date  . Hypertension   . Cataract   . Headache    Current Outpatient Prescriptions on File Prior to Visit  Medication Sig Dispense Refill  . acetaminophen (TYLENOL) 325 MG tablet Take 325 mg by mouth every 6 (six) hours as needed for mild pain.    Marland Kitchen amLODipine (NORVASC) 10 MG tablet Take 1 tablet (10 mg total) by mouth daily. 30 tablet 2  . ciprofloxacin (CILOXAN) 0.3 % ophthalmic solution Place 1 drop into both eyes every 4 (four) hours while awake.   3  . furosemide (LASIX) 20 MG tablet Take 1 tablet (20 mg total) by mouth daily. (Patient not taking: Reported on 06/12/2014) 3 tablet 0   No current facility-administered medications on file prior to visit.   History reviewed. No pertinent family history. History   Social History  . Marital Status: Single    Spouse Name: N/A  . Number of Children: N/A  . Years of Education: N/A   Occupational History  . Not on file.   Social History Main Topics  . Smoking status: Current Every Day Smoker -- 0.25 packs/day for 30 years  . Smokeless tobacco: Never Used     Comment: Smoking .5 ppd  . Alcohol Use: 0.0 oz/week    0 Standard drinks or equivalent per week     Comment: 1-2 times a month   . Drug Use: No  . Sexual Activity: No   Other Topics Concern  . Not on file   Social History Narrative    Review of Systems: See history of present illness   Objective:   Filed Vitals:   08/16/14 1524  BP: 137/77  Pulse: 77  Temp: 97.4 F (36.3 C)  Resp: 16    Physical Exam  Constitutional: He is oriented to person, place, and time.  HENT:  Mouth/Throat: Dental caries present.    Cardiovascular: Normal rate, regular rhythm and normal heart sounds.   Pulmonary/Chest: Effort normal and breath sounds normal.  Musculoskeletal: He exhibits no edema.  Neurological: He is alert and oriented to person, place, and time.     Lab Results  Component Value Date   WBC 9.5 07/10/2014   HGB 14.2 07/10/2014   HCT 44.3 07/10/2014   MCV 76.8* 07/10/2014   PLT 213 07/10/2014   Lab Results  Component Value Date   CREATININE 1.09 07/10/2014   BUN 15 07/10/2014   NA 138 07/10/2014   K 3.9 07/10/2014   CL 103 07/10/2014   CO2 30 07/10/2014    Lab Results  Component Value Date   HGBA1C 5.6 05/01/2014   Lipid Panel  Component Value Date/Time   CHOL 169 05/01/2014 1020   TRIG 134 05/01/2014 1020   HDL 56 05/01/2014 1020   CHOLHDL 3.0 05/01/2014 1020   VLDL 27 05/01/2014 1020   LDLCALC 86 05/01/2014 1020       Assessment and plan:   [name] [name] was seen today for follow-up.  Diagnoses and all orders for this visit:  Essential hypertension Orders: -     amLODipine (NORVASC) 10 MG tablet; Take 1 tablet (10 mg total) by mouth daily. Patient blood pressure is stable and may continue on current medication.  Education on diet, exercise, and modifiable risk factors discussed. Will obtain appropriate labs as needed. Will follow up in 3-6 months.   Dental infection/Swelling of gums Orders: -     traMADol (ULTRAM) 50 MG tablet; Take 1 tablet (50 mg total) by mouth every 8 (eight) hours as needed. -     amoxicillin (AMOXIL) 500 MG capsule; Take 1 capsule (500 mg total) by  mouth 3 (three) times daily. -     Ambulatory referral to Dentistry---[facility] He needs to get in with dentist asap.   May follow in 3-6 months for HTN       Holland CommonsKECK, Aragon Scarantino, NP-C [facility] [phone] 08/16/2014, 3:49 PM

## 2014-08-16 NOTE — Progress Notes (Signed)
Pt is here following up on his HTN. Pt states that he has been sick for 2 months. Pt feels cold and sweaty. Pt reports that he has pain on both side of his upper teeth.

## 2014-11-09 ENCOUNTER — Encounter: Payer: Self-pay | Admitting: Internal Medicine

## 2014-11-09 ENCOUNTER — Ambulatory Visit: Payer: Medicaid Other | Attending: Internal Medicine | Admitting: Internal Medicine

## 2014-11-09 VITALS — BP 125/86 | HR 85 | Temp 98.0°F | Ht 63.0 in | Wt 117.6 lb

## 2014-11-09 DIAGNOSIS — Z1211 Encounter for screening for malignant neoplasm of colon: Secondary | ICD-10-CM

## 2014-11-09 DIAGNOSIS — T148 Other injury of unspecified body region: Secondary | ICD-10-CM

## 2014-11-09 DIAGNOSIS — Z720 Tobacco use: Secondary | ICD-10-CM | POA: Diagnosis not present

## 2014-11-09 DIAGNOSIS — R61 Generalized hyperhidrosis: Secondary | ICD-10-CM | POA: Diagnosis not present

## 2014-11-09 DIAGNOSIS — W57XXXA Bitten or stung by nonvenomous insect and other nonvenomous arthropods, initial encounter: Secondary | ICD-10-CM | POA: Diagnosis not present

## 2014-11-09 DIAGNOSIS — F17200 Nicotine dependence, unspecified, uncomplicated: Secondary | ICD-10-CM

## 2014-11-09 DIAGNOSIS — I10 Essential (primary) hypertension: Secondary | ICD-10-CM | POA: Insufficient documentation

## 2014-11-09 MED ORDER — AMLODIPINE BESYLATE 10 MG PO TABS: 10.0000 mg | ORAL_TABLET | Freq: Every day | ORAL | Status: DC

## 2014-11-09 NOTE — Progress Notes (Signed)
Patient does not know why he is here today.  He has no complaints and is only taking amlodipine for HTN

## 2014-11-09 NOTE — Progress Notes (Signed)
Patient ID: [name] [name], male   DOB: 03/19/1961, 54 y.o.   MRN: [id number] Subjective:  [name] [name] is a 54 y.o. male with hypertension. Patient reports that he wants to know if he has cancer in his body because he has been a smoker since age 40 years old. He has had mild weight loss, decreased appetite, and night sweats. He denies cough, SOB, and chest pain.  Current Outpatient Prescriptions  Medication Sig Dispense Refill  . acetaminophen (TYLENOL) 325 MG tablet Take 325 mg by mouth every 6 (six) hours as needed for mild pain.    Marland Kitchen amLODipine (NORVASC) 10 MG tablet Take 1 tablet (10 mg total) by mouth daily. 30 tablet 4  . amoxicillin (AMOXIL) 500 MG capsule Take 1 capsule (500 mg total) by mouth 3 (three) times daily. (Patient not taking: Reported on 11/09/2014) 30 capsule 0  . ciprofloxacin (CILOXAN) 0.3 % ophthalmic solution Place 1 drop into both eyes every 4 (four) hours while awake.   3  . furosemide (LASIX) 20 MG tablet Take 1 tablet (20 mg total) by mouth daily. (Patient not taking: Reported on 06/12/2014) 3 tablet 0  . traMADol (ULTRAM) 50 MG tablet Take 1 tablet (50 mg total) by mouth every 8 (eight) hours as needed. (Patient not taking: Reported on 11/09/2014) 60 tablet 0   No current facility-administered medications for this visit.    Hypertension ROS: taking medications as instructed, no medication side effects noted, no TIA's, no chest pain on exertion, no dyspnea on exertion and no swelling of ankles.  New concerns: He presents with dead bugs that he believes are biting him. Bug appear to be termites. I do not see any bug bites today.   Objective:  BP 125/86 mmHg  Pulse 85  Temp(Src) 98 F (36.7 C)  Ht  (1.6 m)  Wt 117 lb 9.6 oz (53.343 kg)  BMI 20.84 kg/m2  SpO2 97%  Appearance alert, well appearing, and in no distress and oriented to person, place, and time. General exam BP noted to be well controlled today in office, S1, S2 normal, no gallop, no murmur, chest clear, no JVD, no  HSM, no edema.  Lab review: labs are reviewed, up to date and normal.   Assessment:  [name] [name] was seen today for hypertension.  Diagnoses and all orders for this visit:  Essential hypertension Orders: -     amLODipine (NORVASC) 10 MG tablet; Take 1 tablet (10 mg total) by mouth daily. Hypertension well controlled and needs to quit smoking.  Patient blood pressure is stable and may continue on current medication.  Education on diet, exercise, and modifiable risk factors discussed. Will obtain appropriate labs as needed. Will follow up in 3-6 months.   Night sweats Orders: -     DG Chest 2 View; Future  Smoker See above  Smoking cessation discussed for 3 minutes, patient is not willing to quit at this time. Will continue to assess on each visit. Discussed increased risk for diseases such as cancer, heart disease, and stroke.    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 Patient may try calling the local health department for further assistance.    Colon cancer screening Orders: -     Ambulatory referral to Gastroenterology  Return in about 6 months (around 05/12/2015) for Hypertension.  Ambrose Finland,  NP 11/19/2014 7:38 PM

## 2014-11-19 DIAGNOSIS — F17200 Nicotine dependence, unspecified, uncomplicated: Secondary | ICD-10-CM | POA: Insufficient documentation

## 2014-12-11 ENCOUNTER — Encounter: Payer: Self-pay | Admitting: Internal Medicine

## 2015-05-18 ENCOUNTER — Ambulatory Visit: Payer: Medicaid Other | Attending: Internal Medicine | Admitting: Internal Medicine

## 2015-05-18 ENCOUNTER — Encounter: Payer: Self-pay | Admitting: Internal Medicine

## 2015-05-18 VITALS — BP 155/88 | HR 89 | Temp 98.6°F | Resp 17 | Ht 63.0 in | Wt 127.4 lb

## 2015-05-18 DIAGNOSIS — Z79899 Other long term (current) drug therapy: Secondary | ICD-10-CM | POA: Diagnosis not present

## 2015-05-18 DIAGNOSIS — Z6822 Body mass index (BMI) 22.0-22.9, adult: Secondary | ICD-10-CM | POA: Diagnosis not present

## 2015-05-18 DIAGNOSIS — Z0000 Encounter for general adult medical examination without abnormal findings: Secondary | ICD-10-CM | POA: Diagnosis not present

## 2015-05-18 DIAGNOSIS — I10 Essential (primary) hypertension: Secondary | ICD-10-CM | POA: Insufficient documentation

## 2015-05-18 LAB — BASIC METABOLIC PANEL
BUN: 14 mg/dL (ref 7–25)
CHLORIDE: 102 mmol/L (ref 98–110)
CO2: 29 mmol/L (ref 20–31)
CREATININE: 0.98 mg/dL (ref 0.70–1.33)
Calcium: 9.1 mg/dL (ref 8.6–10.3)
Glucose, Bld: 156 mg/dL — ABNORMAL HIGH (ref 65–99)
Potassium: 5.2 mmol/L (ref 3.5–5.3)
Sodium: 139 mmol/L (ref 135–146)

## 2015-05-18 MED ORDER — AMLODIPINE BESYLATE 10 MG PO TABS: 10.0000 mg | ORAL_TABLET | Freq: Every day | ORAL | Status: DC

## 2015-05-18 NOTE — Progress Notes (Signed)
Patient ID: [name] [name], male   DOB: 11/25/1960, 55 y.o.   MRN: [id number] Subjective:  [name] [name] is a 55 y.o. male with hypertension. Patient reports that he has been out of his blood pressure medication for the past 4 months because he does not have transportation to get to the pharmacy. He is now requesting that his medication be sent to a pharmacy closer to his home.  Current Outpatient Prescriptions  Medication Sig Dispense Refill  . amLODipine (NORVASC) 10 MG tablet Take 1 tablet (10 mg total) by mouth daily. 30 tablet 4  . ibuprofen (ADVIL,MOTRIN) 800 MG tablet Take 800 mg by mouth every 8 (eight) hours as needed.    Marland Kitchen. acetaminophen (TYLENOL) 325 MG tablet Take 325 mg by mouth every 6 (six) hours as needed for mild pain.    Marland Kitchen. amoxicillin (AMOXIL) 500 MG capsule Take 1 capsule (500 mg total) by mouth 3 (three) times daily. (Patient not taking: Reported on 11/09/2014) 30 capsule 0  . ciprofloxacin (CILOXAN) 0.3 % ophthalmic solution Place 1 drop into both eyes every 4 (four) hours while awake.   3  . furosemide (LASIX) 20 MG tablet Take 1 tablet (20 mg total) by mouth daily. (Patient not taking: Reported on 06/12/2014) 3 tablet 0  . traMADol (ULTRAM) 50 MG tablet Take 1 tablet (50 mg total) by mouth every 8 (eight) hours as needed. (Patient not taking: Reported on 11/09/2014) 60 tablet 0   No current facility-administered medications for this visit.    Hypertension ROS: taking medications as instructed, no medication side effects noted, no TIA's, no chest pain on exertion, no dyspnea on exertion, no swelling of ankles and no palpitations.   Objective:  BP 155/88 mmHg  Pulse 89  Temp(Src) 98.6 F (37 C)  Resp 17  Ht 5\' 3"  (1.6 m)  Wt 127 lb 6.4 oz (57.788 kg)  BMI 22.57 kg/m2  SpO2 100%  Appearance alert, well appearing, and in no distress, oriented to person, place, and time and normal appearing weight. General exam BP noted to be mildly elevated today in office though normal at other visits,  S1, S2 normal, no gallop, no murmur, chest clear, no JVD, no HSM, no edema.  Lab review: lipid panel is not up to date, patient does not have transportation to come back fasting for labs.   Assessment:   [name] [name] was seen today for follow-up.  Diagnoses and all orders for this visit:  Essential hypertension -     Refilled amLODipine (NORVASC) 10 MG tablet; Take 1 tablet (10 mg total) by mouth daily. -     Basic Metabolic Panel Patient blood pressure is stable and may continue on current medication.  Education on diet, exercise, and modifiable risk factors discussed. Will obtain appropriate labs as needed. Will follow up in 3-6 months.   Healthcare maintenance -     Flu Vaccine QUAD 36+ mos PF IM (Fluarix & Fluzone Quad PF)   Return in about 3 months (around 08/16/2015) for Hypertension.  Ambrose FinlandValerie A Keck, NP 05/18/2015 6:05 PM

## 2015-05-18 NOTE — Patient Instructions (Signed)
Do not eat breakfast the day of your next visit so that we can check blood work

## 2015-05-18 NOTE — Progress Notes (Signed)
Patient here for follow up on his HTN Patient has been out of his medication since this past sept

## 2015-05-21 ENCOUNTER — Telehealth: Payer: Self-pay

## 2015-05-21 NOTE — Telephone Encounter (Signed)
Spoke with patient and he is aware of his lab results 

## 2015-05-21 NOTE — Telephone Encounter (Signed)
-----   Message from Ambrose FinlandValerie A Keck, NP sent at 05/21/2015  2:06 PM EST ----- Labs are within normal limits

## 2016-06-17 ENCOUNTER — Encounter: Payer: Self-pay | Admitting: Family Medicine

## 2016-06-17 ENCOUNTER — Ambulatory Visit: Payer: Medicare Other | Attending: Family Medicine | Admitting: Family Medicine

## 2016-06-17 VITALS — BP 138/81 | HR 66 | Temp 97.6°F | Resp 18 | Ht 64.0 in | Wt 128.4 lb

## 2016-06-17 DIAGNOSIS — R202 Paresthesia of skin: Secondary | ICD-10-CM | POA: Insufficient documentation

## 2016-06-17 DIAGNOSIS — M79602 Pain in left arm: Secondary | ICD-10-CM | POA: Insufficient documentation

## 2016-06-17 DIAGNOSIS — I10 Essential (primary) hypertension: Secondary | ICD-10-CM | POA: Diagnosis not present

## 2016-06-17 DIAGNOSIS — M6281 Muscle weakness (generalized): Secondary | ICD-10-CM | POA: Diagnosis not present

## 2016-06-17 DIAGNOSIS — G8929 Other chronic pain: Secondary | ICD-10-CM | POA: Diagnosis not present

## 2016-06-17 DIAGNOSIS — Z23 Encounter for immunization: Secondary | ICD-10-CM | POA: Diagnosis not present

## 2016-06-17 DIAGNOSIS — R200 Anesthesia of skin: Secondary | ICD-10-CM

## 2016-06-17 LAB — BASIC METABOLIC PANEL WITH GFR
BUN: 13 mg/dL (ref 7–25)
CALCIUM: 9.7 mg/dL (ref 8.6–10.3)
CO2: 29 mmol/L (ref 20–31)
CREATININE: 0.92 mg/dL (ref 0.70–1.33)
Chloride: 104 mmol/L (ref 98–110)
GFR, Est African American: >89 mL/min (ref >=60)
Glucose, Bld: 92 mg/dL (ref 65–99)
Potassium: 4.5 mmol/L (ref 3.5–5.3)
SODIUM: 141 mmol/L (ref 135–146)

## 2016-06-17 MED ORDER — IBUPROFEN 800 MG PO TABS: 800.0000 mg | ORAL_TABLET | Freq: Three times a day (TID) | ORAL | 0 refills | Status: DC | PRN

## 2016-06-17 MED ORDER — AMLODIPINE BESYLATE 10 MG PO TABS: 10.0000 mg | ORAL_TABLET | Freq: Every day | ORAL | 2 refills | Status: DC

## 2016-06-17 MED FILL — IBUPROFEN 800 MG TABLET: 800 | 10 days supply | Qty: 30 | Fill #0

## 2016-06-17 MED FILL — AMLODIPINE BESYLATE 10 MG T: 10 | 30 days supply | Qty: 30 | Fill #0
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
## 2016-06-17 NOTE — Patient Instructions (Addendum)
Hypertension Hypertension is another name for high blood pressure. High blood pressure forces your heart to work harder to pump blood. A blood pressure reading has two numbers, which includes a higher number over a lower number (example: 110/72). Follow these instructions at home:  Have your blood pressure rechecked by your doctor.  Only take medicine as told by your doctor. Follow the directions carefully. The medicine does not work as well if you skip doses. Skipping doses also puts you at risk for problems.  Do not smoke.  Monitor your blood pressure at home as told by your doctor. Contact a doctor if:  You think you are having a reaction to the medicine you are taking.  You have repeat headaches or feel dizzy.  You have puffiness (swelling) in your ankles.  You have trouble with your vision. Get help right away if:  You get a very bad headache and are confused.  You feel weak, numb, or faint.  You get chest or belly (abdominal) pain.  You throw up (vomit).  You cannot breathe very well. This information is not intended to replace advice given to you by your health care provider. Make sure you discuss any questions you have with your health care provider. Document Released: 10/08/2007 Document Revised: 09/27/2015 Document Reviewed: 02/11/2013 Elsevier Interactive Patient Education  2017 Elsevier Inc.  
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
## 2016-06-18 LAB — MICROALBUMIN / CREATININE URINE RATIO
CREATININE, URINE: 37 mg/dL (ref 20–370)
Microalb Creat Ratio: 8 mcg/mg creat (ref <30)
Microalb, Ur: 0.3 mg/dL

## 2016-06-20 ENCOUNTER — Ambulatory Visit (INDEPENDENT_AMBULATORY_CARE_PROVIDER_SITE_OTHER): Payer: Medicare Other | Admitting: Orthopaedic Surgery

## 2016-06-20 ENCOUNTER — Encounter (INDEPENDENT_AMBULATORY_CARE_PROVIDER_SITE_OTHER): Payer: Self-pay | Admitting: Orthopaedic Surgery

## 2016-06-20 ENCOUNTER — Ambulatory Visit (INDEPENDENT_AMBULATORY_CARE_PROVIDER_SITE_OTHER): Payer: Medicare Other

## 2016-06-20 DIAGNOSIS — M5412 Radiculopathy, cervical region: Secondary | ICD-10-CM | POA: Diagnosis not present

## 2016-06-20 DIAGNOSIS — M25512 Pain in left shoulder: Secondary | ICD-10-CM | POA: Diagnosis not present

## 2016-06-20 DIAGNOSIS — G8929 Other chronic pain: Secondary | ICD-10-CM | POA: Diagnosis not present

## 2016-06-20 NOTE — Addendum Note (Signed)
Addended by: Albertina ParrGARCIA, Noha Milberger on: 06/20/2016 10:22 AM   Modules accepted: Orders
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
## 2016-06-24 ENCOUNTER — Telehealth: Payer: Self-pay

## 2016-06-24 NOTE — Telephone Encounter (Signed)
-----   Message from Lizbeth BarkMandesia R Hairston, [place] sent at 06/23/2016 12:56 PM EST ----- Kidney function normal Microalbumin/creatinine ratio level was normal. This tests for protein in your urine that can indicate early signs of kidney damage.

## 2016-06-24 NOTE — Telephone Encounter (Signed)
CMA call to go over lab results  Patient Verify DOB  Patient was aware and understood   

## 2016-07-04 ENCOUNTER — Encounter: Payer: Self-pay | Admitting: Family Medicine

## 2016-07-04 ENCOUNTER — Ambulatory Visit: Payer: Medicare Other | Attending: Family Medicine | Admitting: Family Medicine

## 2016-07-04 VITALS — BP 125/72 | HR 77 | Temp 97.7°F | Resp 18 | Ht 63.0 in | Wt 127.6 lb

## 2016-07-04 DIAGNOSIS — B079 Viral wart, unspecified: Secondary | ICD-10-CM | POA: Diagnosis not present

## 2016-07-04 DIAGNOSIS — W57XXXA Bitten or stung by nonvenomous insect and other nonvenomous arthropods, initial encounter: Secondary | ICD-10-CM | POA: Insufficient documentation

## 2016-07-04 DIAGNOSIS — T148XXA Other injury of unspecified body region, initial encounter: Secondary | ICD-10-CM | POA: Diagnosis not present

## 2016-07-04 MED ORDER — SALICYLIC ACID 17 % EX LIQD: Freq: Every day | CUTANEOUS | 0 refills | Status: DC

## 2016-07-04 MED ORDER — HYDROCORTISONE 2.5 % EX CREA: TOPICAL_CREAM | Freq: Two times a day (BID) | CUTANEOUS | 0 refills | Status: DC | PRN

## 2016-07-04 NOTE — Progress Notes (Signed)
Subjective:  Patient ID: [name] BaRon Labate, male    DOB: 06/26/1960  Age: 56 y.o. MRN: [id number]  CC: Establish Care   HPI [name] [name] presents for   Skin complaint: C/o bug bites for 3 years. Reports noticing insects after visiting [place] in 2015 and coming back to the [place]. He brings insects with him in container during office visit. Reports bites happen to be worse at night. Reports insects fly around his apartment home. Reports his neighbors denies any similar problems. Reports itching with bites denies taking anything for symptoms.      Outpatient Medications Prior to Visit  Medication Sig Dispense Refill  . amLODipine (NORVASC) 10 MG tablet Take 1 tablet (10 mg total) by mouth daily. 30 tablet 2  . ibuprofen (ADVIL,MOTRIN) 800 MG tablet Take 1 tablet (800 mg total) by mouth every 8 (eight) hours as needed for mild pain or moderate pain (Take with food.). 30 tablet 0  . acetaminophen (TYLENOL) 325 MG tablet Take 325 mg by mouth every 6 (six) hours as needed for mild pain.     No facility-administered medications prior to visit.     ROS Review of Systems  Constitutional: Negative.   Respiratory: Negative.   Cardiovascular: Negative.   Skin:       Pruritic insect bites to fingers and neck.      Objective:  BP 125/72 (BP Location: Left Arm, Patient Position: Sitting, Cuff Size: Normal)   Pulse 77   Temp 97.7 F (36.5 C) (Oral)   Resp 18   Ht 5\' 3"  (1.6 m)   Wt 127 lb 9.6 oz (57.9 kg)   SpO2 98%   BMI 22.60 kg/m   BP/Weight 07/04/2016 06/17/2016 05/18/2015  Systolic BP 125 138 155  Diastolic BP 72 81 88  Wt. (Lbs) 127.6 128.4 127.4  BMI 22.6 22.04 22.57     Physical Exam  Neck: No JVD present.  Cardiovascular: Normal rate, regular rhythm, normal heart sounds and intact distal pulses.   Pulmonary/Chest: Effort normal and breath sounds normal.  Abdominal: Soft. Bowel sounds are normal.  Skin: Skin is warm and dry.  No visible rash or papules seen. Mild erythema to neck  and fingertips.   Nursing note and vitals reviewed.    Assessment & Plan:   Problem List Items Addressed This Visit    None    Visit Diagnoses    Bug bite, initial encounter    -  Primary   -Bugs do not appear to be bedbugs. Recommended pt.disucss issue with his landlord and a professional     bug exterminator.    Relevant Medications   hydrocortisone 2.5 % cream   Viral wart on finger       Relevant Medications   salicylic acid-lactic acid (COMPOUND W) 17 % external solution      Meds ordered this encounter  Medications  . salicylic acid-lactic acid (COMPOUND W) 17 % external solution    Sig: Apply topically daily. For 12 weeks.    Dispense:  15 mL    Refill:  0    Order Specific Question:   Supervising Provider    Answer:   Quentin AngstJEGEDE, OLUGBEMIGA E [id number]]  . hydrocortisone 2.5 % cream    Sig: Apply topically 2 (two) times daily as needed. Apply to affected areas    Dispense:  30 g    Refill:  0    Order Specific Question:   Supervising Provider    Answer:   Quentin AngstJEGEDE, OLUGBEMIGA E [  [id number]]    Follow-up: Return As needed.Lizbeth Bark FNP

## 2016-07-04 NOTE — Patient Instructions (Addendum)
Recommend follow up with bug exterminator about insect problem.  Insect Bite, Adult An insect bite can make your skin red, itchy, and swollen. Some insects can spread disease to people with a bite. However, most insect bites do not lead to disease, and most are not serious. Follow these instructions at home: Bite area care   Do not scratch the bite area.  Keep the bite area clean and dry.  Wash the bite area every day with soap and water as told by your doctor.  Check the bite area every day for signs of infection. Check for:  More redness, swelling, or pain.  Fluid or blood.  Warmth.  Pus. Managing pain, itching, and swelling   You may put any of these on the bite area as told by your doctor:  A baking soda paste.  Cortisone cream.  Calamine lotion.  If directed, put ice on the bite area.  Put ice in a plastic bag.  Place a towel between your skin and the bag.  Leave the ice on for 20 minutes, 2-3 times a day. Medicines   Take medicines or put medicines on your skin only as told by your doctor.  If you were prescribed an antibiotic medicine, use it as told by your doctor. Do not stop using the antibiotic even if your condition improves. General instructions   Keep all follow-up visits as told by your doctor. This is important. How is this prevented? To help you have a lower risk of insect bites:  When you are outside, wear clothing that covers your arms and legs.  Use insect repellent. The best insect repellents have:  An active ingredient of DEET, picaridin, oil of lemon eucalyptus (OLE), or IR3535.  Higher amounts of DEET or another active ingredient than other repellents have.  If your home windows do not have screens, think about putting some in. Contact a doctor if:  You have more redness, swelling, or pain in the bite area.  You have fluid, blood, or pus coming from the bite area.  The bite area feels warm.  You have a fever. Get help right  away if:  You have joint pain.  You have a rash.  You have shortness of breath.  You feel more tired or sleepy than you normally do.  You have neck pain.  You have a headache.  You feel weaker than you normally do.  You have chest pain.  You have pain in your belly.  You feel sick to your stomach (nauseous) or you throw up (vomit). Summary  An insect bite can make your skin red, itchy, and swollen.  Do not scratch the bite area, and keep it clean and dry.  Ice can help with pain and itching from the bite. This information is not intended to replace advice given to you by your health care provider. Make sure you discuss any questions you have with your health care provider. Document Released: 04/18/2000 Document Revised: 11/22/2015 Document Reviewed: 09/06/2014 Elsevier Interactive Patient Education  2017 Elsevier Inc. Warts Warts are small growths on the skin. They are common, and they are caused by a type of germ (virus). Warts can occur on many areas of the body. A person may have one wart or more than one wart. Warts can spread if you scratch a wart and then scratch normal skin. Most warts will go away over many months to a couple years. Treatments may be done if needed. Follow these instructions at home:  Apply over-the-counter  and prescription medicines only as told by your doctor.  Do not apply over-the-counter wart medicines to your face or genitals before you ask your doctor if it is okay to do that.  Do not scratch or pick at a wart.  Wash your hands after you touch a wart.  Avoid shaving hair that is over a wart.  Keep all follow-up visits as told by your doctor. This is important. Contact a doctor if:  Your warts do not improve after treatment.  You have redness, swelling, or pain at the site of a wart.  You have bleeding from a wart, and the bleeding does not stop when you put light pressure on the wart.  You have diabetes and you get a wart. This  information is not intended to replace advice given to you by your health care provider. Make sure you discuss any questions you have with your health care provider. Document Released: 08/22/2010 Document Revised: 09/27/2015 Document Reviewed: 07/17/2014 Elsevier Interactive Patient Education  2017 [employer].

## 2016-07-04 NOTE — Progress Notes (Signed)
Patient is here for bumps around his neck, part of the chin, & his fingers   Patient still complains about left shoulder pain  Patient has eaten today  Patient has taking his meds today

## 2016-07-11 ENCOUNTER — Ambulatory Visit
Admission: RE | Admit: 2016-07-11 | Discharge: 2016-07-11 | Disposition: A | Discharge status: Home / Self Care | Payer: Medicare Other | Source: Ambulatory Visit | Attending: Orthopaedic Surgery | Admitting: Orthopaedic Surgery

## 2016-07-11 DIAGNOSIS — M5412 Radiculopathy, cervical region: Secondary | ICD-10-CM

## 2016-07-11 DIAGNOSIS — M4802 Spinal stenosis, cervical region: Secondary | ICD-10-CM | POA: Diagnosis not present

## 2016-07-15 ENCOUNTER — Ambulatory Visit (INDEPENDENT_AMBULATORY_CARE_PROVIDER_SITE_OTHER): Payer: Medicare Other | Admitting: Orthopaedic Surgery

## 2016-07-15 ENCOUNTER — Encounter (INDEPENDENT_AMBULATORY_CARE_PROVIDER_SITE_OTHER): Payer: Self-pay | Admitting: Orthopaedic Surgery

## 2016-07-15 DIAGNOSIS — M5412 Radiculopathy, cervical region: Secondary | ICD-10-CM | POA: Diagnosis not present

## 2016-07-15 NOTE — Addendum Note (Signed)
Addended by: Albertina ParrGARCIA, Ladd Cen on: 07/15/2016 11:12 AM   Modules accepted: Orders
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
## 2016-07-23 DIAGNOSIS — I10 Essential (primary) hypertension: Secondary | ICD-10-CM | POA: Diagnosis not present

## 2016-07-23 DIAGNOSIS — M4802 Spinal stenosis, cervical region: Secondary | ICD-10-CM | POA: Diagnosis not present

## 2016-10-09 ENCOUNTER — Encounter: Payer: Self-pay | Admitting: Family Medicine

## 2016-10-09 ENCOUNTER — Ambulatory Visit: Payer: Medicare Other | Attending: Family Medicine | Admitting: Family Medicine

## 2016-10-09 VITALS — BP 120/77 | HR 61 | Temp 97.6°F | Resp 18 | Ht 63.0 in | Wt 123.8 lb

## 2016-10-09 DIAGNOSIS — G8929 Other chronic pain: Secondary | ICD-10-CM | POA: Diagnosis not present

## 2016-10-09 DIAGNOSIS — Z23 Encounter for immunization: Secondary | ICD-10-CM

## 2016-10-09 DIAGNOSIS — F17200 Nicotine dependence, unspecified, uncomplicated: Secondary | ICD-10-CM

## 2016-10-09 DIAGNOSIS — Z8679 Personal history of other diseases of the circulatory system: Secondary | ICD-10-CM

## 2016-10-09 DIAGNOSIS — Z79899 Other long term (current) drug therapy: Secondary | ICD-10-CM | POA: Insufficient documentation

## 2016-10-09 DIAGNOSIS — M542 Cervicalgia: Secondary | ICD-10-CM

## 2016-10-09 DIAGNOSIS — Z0000 Encounter for general adult medical examination without abnormal findings: Secondary | ICD-10-CM

## 2016-10-09 DIAGNOSIS — R630 Anorexia: Secondary | ICD-10-CM | POA: Diagnosis not present

## 2016-10-09 DIAGNOSIS — I10 Essential (primary) hypertension: Secondary | ICD-10-CM | POA: Insufficient documentation

## 2016-10-09 MED ORDER — ENSURE ACTIVE PO LIQD: 1.0000 | Freq: Every day | ORAL | Status: DC | PRN

## 2016-10-09 MED ORDER — IBUPROFEN 600 MG PO TABS: 600.0000 mg | ORAL_TABLET | Freq: Three times a day (TID) | ORAL | 1 refills | Status: DC | PRN

## 2016-10-09 MED ORDER — LISINOPRIL 5 MG PO TABS: 5.0000 mg | ORAL_TABLET | Freq: Every day | ORAL | 2 refills | Status: DC

## 2016-10-09 MED ORDER — BUPROPION HCL ER (SR) 150 MG PO TB12: 150.0000 mg | ORAL_TABLET | Freq: Two times a day (BID) | ORAL | 0 refills | Status: DC

## 2016-10-09 NOTE — Progress Notes (Signed)
Patient is here for f/up HTN 

## 2016-10-09 NOTE — Progress Notes (Signed)
Subjective:  Patient ID: [name] [name], male    DOB: April 03, 1961  Age: 56 y.o. MRN: [id number]  CC: Hypertension   HPI [name] [name] presents for hypertension follow up. He reports only taking 30 day supply of 90 day supply of medication for blood pressure. He reports attempts to get refills. He is a current everyday smoker. He reports being ready to quit smoking. Denies any CP, SOB, swelling of BLE, or claudication symptoms. He report regular exercise everyday with riding his bike. Report recent history of poor appetite for 2 weeks. Eats 1 to 2 meals per day. He denies weight loss, changes to bowel or bladder habits, N/V, or abdominal pain. History of chronic neck pain. Evaluation in the past has included orthopedic specialist evaluation and imaging studies. He reports surgical intervention was recommended but he declined.      Outpatient Medications Prior to Visit  Medication Sig Dispense Refill  . acetaminophen (TYLENOL) 325 MG tablet Take 325 mg by mouth every 6 (six) hours as needed for mild pain.    . hydrocortisone 2.5 % cream Apply topically 2 (two) times daily as needed. Apply to affected areas 30 g 0  . salicylic acid-lactic acid (COMPOUND W) 17 % external solution Apply topically daily. For 12 weeks. 15 mL 0  . amLODipine (NORVASC) 10 MG tablet Take 1 tablet (10 mg total) by mouth daily. 30 tablet 2  . ibuprofen (ADVIL,MOTRIN) 800 MG tablet Take 1 tablet (800 mg total) by mouth every 8 (eight) hours as needed for mild pain or moderate pain (Take with food.). 30 tablet 0   No facility-administered medications prior to visit.     ROS Review of Systems  Constitutional: Positive for appetite change.  Eyes: Negative.   Respiratory: Negative.   Cardiovascular: Negative.   Gastrointestinal: Negative.   Musculoskeletal: Positive for arthralgias and neck pain.  Skin: Negative.   Neurological: Negative.   Psychiatric/Behavioral: Negative.     Objective:  BP 120/77 (BP Location: Left Arm,  Patient Position: Sitting, Cuff Size: Normal)   Pulse 61   Temp 97.6 F (36.4 C) (Oral)   Resp 18   Ht 5\' 3"  (1.6 m)   Wt 123 lb 12.8 oz (56.2 kg)   SpO2 100%   BMI 21.93 kg/m   BP/Weight 10/09/2016 07/04/2016 06/17/2016  Systolic BP 120 125 138  Diastolic BP 77 72 81  Wt. (Lbs) 123.8 127.6 128.4  BMI 21.93 22.6 22.04    Physical Exam  Constitutional: He appears well-developed and well-nourished.  Eyes: Conjunctivae are normal. Pupils are equal, round, and reactive to light.  Neck: No JVD present.  Cardiovascular: Normal rate, regular rhythm, normal heart sounds and intact distal pulses.   Pulmonary/Chest: Effort normal and breath sounds normal.  Abdominal: Soft. Bowel sounds are normal. There is no tenderness.  Musculoskeletal:       Cervical back: He exhibits pain.  Skin: Skin is warm and dry.  Psychiatric: He has a normal mood and affect.  Nursing note and vitals reviewed.  Assessment & Plan:   Problem List Items Addressed This Visit    None    Visit Diagnoses    History of hypertension    -  Primary   Relevant Medications   lisinopril (PRINIVIL,ZESTRIL) 5 MG tablet   Chronic midline posterior neck pain       Relevant Medications   ibuprofen (ADVIL,MOTRIN) 600 MG tablet   Tobacco dependence       Relevant Medications   buPROPion (WELLBUTRIN SR)  150 MG 12 hr tablet   Decreased appetite       Relevant Medications   Nutritional Supplements (ENSURE ACTIVE) LIQD   Healthcare maintenance       Relevant Orders   Ambulatory referral to Gastroenterology   Tdap vaccine greater than or equal to 7yo IM (Completed)      Meds ordered this encounter  Medications  . lisinopril (PRINIVIL,ZESTRIL) 5 MG tablet    Sig: Take 1 tablet (5 mg total) by mouth daily.    Dispense:  30 tablet    Refill:  2    Order Specific Question:   Supervising Provider    Answer:   Quentin AngstJEGEDE, OLUGBEMIGA E [id number]  . ibuprofen (ADVIL,MOTRIN) 600 MG tablet    Sig: Take 1 tablet (600 mg total) by  mouth every 8 (eight) hours as needed for moderate pain (Take with food.).    Dispense:  30 tablet    Refill:  1    Order Specific Question:   Supervising Provider    Answer:   Quentin AngstJEGEDE, OLUGBEMIGA E [id number]]  . buPROPion (WELLBUTRIN SR) 150 MG 12 hr tablet    Sig: Take 1 tablet (150 mg total) by mouth 2 (two) times daily.    Dispense:  90 tablet    Refill:  0    Order Specific Question:   Supervising Provider    Answer:   Quentin AngstJEGEDE, OLUGBEMIGA E [id number]  . Nutritional Supplements (ENSURE ACTIVE) LIQD    Sig: Take 1 Bottle by mouth daily as needed.    Order Specific Question:   Supervising Provider    Answer:   Quentin AngstJEGEDE, OLUGBEMIGA E [[id number]]    Follow-up: Return in about 2 weeks (around 10/23/2016) for Physical.   Lizbeth BarkMandesia R Omarri Eich FNP

## 2016-10-09 NOTE — Patient Instructions (Addendum)
Managing Your Hypertension Hypertension is commonly called high blood pressure. This is when the force of your blood pressing against the walls of your arteries is too strong. Arteries are blood vessels that carry blood from your heart throughout your body. Hypertension forces the heart to work harder to pump blood, and may cause the arteries to become narrow or stiff. Having untreated or uncontrolled hypertension can cause heart attack, stroke, kidney disease, and other problems. What are blood pressure readings? A blood pressure reading consists of a higher number over a lower number. Ideally, your blood pressure should be below 120/80. The first ("top") number is called the systolic pressure. It is a measure of the pressure in your arteries as your heart beats. The second ("bottom") number is called the diastolic pressure. It is a measure of the pressure in your arteries as the heart relaxes. What does my blood pressure reading mean? Blood pressure is classified into four stages. Based on your blood pressure reading, your health care provider may use the following stages to determine what type of treatment you need, if any. Systolic pressure and diastolic pressure are measured in a unit called mm Hg. Normal  Systolic pressure: below 120.  Diastolic pressure: below 80. Elevated  Systolic pressure: 120-129.  Diastolic pressure: below 80. Hypertension stage 1  Systolic pressure: 130-139.  Diastolic pressure: 80-89. Hypertension stage 2  Systolic pressure: 140 or above.  Diastolic pressure: 90 or above. What health risks are associated with hypertension? Managing your hypertension is an important responsibility. Uncontrolled hypertension can lead to:  A heart attack.  A stroke.  A weakened blood vessel (aneurysm).  Heart failure.  Kidney damage.  Eye damage.  Metabolic syndrome.  Memory and concentration problems.  What changes can I make to manage my  hypertension? Hypertension can be managed by making lifestyle changes and possibly by taking medicines. Your health care provider will help you make a plan to bring your blood pressure within a normal range. Eating and drinking  Eat a diet that is high in fiber and potassium, and low in salt (sodium), added sugar, and fat. An example eating plan is called the DASH (Dietary Approaches to Stop Hypertension) diet. To eat this way: ? Eat plenty of fresh fruits and vegetables. Try to fill half of your plate at each meal with fruits and vegetables. ? Eat whole grains, such as whole wheat pasta, brown rice, or whole grain bread. Fill about one quarter of your plate with whole grains. ? Eat low-fat diary products. ? Avoid fatty cuts of meat, processed or cured meats, and poultry with skin. Fill about one quarter of your plate with lean proteins such as fish, chicken without skin, beans, eggs, and tofu. ? Avoid premade and processed foods. These tend to be higher in sodium, added sugar, and fat.  Reduce your daily sodium intake. Most people with hypertension should eat less than 1,500 mg of sodium a day.  Limit alcohol intake to no more than 1 drink a day for nonpregnant women and 2 drinks a day for men. One drink equals 12 oz of beer, 5 oz of wine, or 1 oz of hard liquor. Lifestyle  Work with your health care provider to maintain a healthy body weight, or to lose weight. Ask what an ideal weight is for you.  Get at least 30 minutes of exercise that causes your heart to beat faster (aerobic exercise) most days of the week. Activities may include walking, swimming, or biking.  Include exercise   to strengthen your muscles (resistance exercise), such as weight lifting, as part of your weekly exercise routine. Try to do these types of exercises for 30 minutes at least 3 days a week.  Do not use any products that contain nicotine or tobacco, such as cigarettes and e-cigarettes. If you need help quitting, ask  your health care provider.  Control any long-term (chronic) conditions you have, such as high cholesterol or diabetes. Monitoring  Monitor your blood pressure at home as told by your health care provider. Your personal target blood pressure may vary depending on your medical conditions, your age, and other factors.  Have your blood pressure checked regularly, as often as told by your health care provider. Working with your health care provider  Review all the medicines you take with your health care provider because there may be side effects or interactions.  Talk with your health care provider about your diet, exercise habits, and other lifestyle factors that may be contributing to hypertension.  Visit your health care provider regularly. Your health care provider can help you create and adjust your plan for managing hypertension. Will I need medicine to control my blood pressure? Your health care provider may prescribe medicine if lifestyle changes are not enough to get your blood pressure under control, and if:  Your systolic blood pressure is 130 or higher.  Your diastolic blood pressure is 80 or higher.  Take medicines only as told by your health care provider. Follow the directions carefully. Blood pressure medicines must be taken as prescribed. The medicine does not work as well when you skip doses. Skipping doses also puts you at risk for problems. Contact a health care provider if:  You think you are having a reaction to medicines you have taken.  You have repeated (recurrent) headaches.  You feel dizzy.  You have swelling in your ankles.  You have trouble with your vision. Get help right away if:  You develop a severe headache or confusion.  You have unusual weakness or numbness, or you feel faint.  You have severe pain in your chest or abdomen.  You vomit repeatedly.  You have trouble breathing. Summary  Hypertension is when the force of blood pumping through  your arteries is too strong. If this condition is not controlled, it may put you at risk for serious complications.  Your personal target blood pressure may vary depending on your medical conditions, your age, and other factors. For most people, a normal blood pressure is less than 120/80.  Hypertension is managed by lifestyle changes, medicines, or both. Lifestyle changes include weight loss, eating a healthy, low-sodium diet, exercising more, and limiting alcohol. This information is not intended to replace advice given to you by your health care provider. Make sure you discuss any questions you have with your health care provider. Document Released: 01/14/2012 Document Revised: 03/19/2016 Document Reviewed: 03/19/2016 Elsevier Interactive Patient Education  2018 [employer]. Lisinopril tablets What is this medicine? LISINOPRIL (lyse IN oh pril) is an ACE inhibitor. This medicine is used to treat high blood pressure and heart failure. It is also used to protect the heart immediately after a heart attack. This medicine may be used for other purposes; ask your health care provider or pharmacist if you have questions. COMMON BRAND NAME(S): Prinivil, Zestril What should I tell my health care provider before I take this medicine? They need to know if you have any of these conditions: -diabetes -heart or blood vessel disease -kidney disease -low blood  pressure -previous swelling of the tongue, face, or lips with difficulty breathing, difficulty swallowing, hoarseness, or tightening of the throat -an unusual or allergic reaction to lisinopril, other ACE inhibitors, insect venom, foods, dyes, or preservatives -pregnant or trying to get pregnant -breast-feeding How should I use this medicine? Take this medicine by mouth with a glass of water. Follow the directions on your prescription label. You may take this medicine with or without food. If it upsets your stomach, take it with food. Take your  medicine at regular intervals. Do not take it more often than directed. Do not stop taking except on your doctor's advice. Talk to your pediatrician regarding the use of this medicine in children. Special care may be needed. While this drug may be prescribed for children as young as 596 years of age for selected conditions, precautions do apply. Overdosage: If you think you have taken too much of this medicine contact a poison control center or emergency room at once. NOTE: This medicine is only for you. Do not share this medicine with others. What if I miss a dose? If you miss a dose, take it as soon as you can. If it is almost time for your next dose, take only that dose. Do not take double or extra doses. What may interact with this medicine? Do not take this medicine with any of the following medications: -hymenoptera venom -sacubitril; valsartan This medicines may also interact with the following medications: -aliskiren -angiotensin receptor blockers, like losartan or valsartan -certain medicines for diabetes -diuretics -everolimus -gold compounds -lithium -NSAIDs, medicines for pain and inflammation, like ibuprofen or naproxen -potassium salts or supplements -salt substitutes -sirolimus -temsirolimus This list may not describe all possible interactions. Give your health care provider a list of all the medicines, herbs, non-prescription drugs, or dietary supplements you use. Also tell them if you smoke, drink alcohol, or use illegal drugs. Some items may interact with your medicine. What should I watch for while using this medicine? Visit your doctor or health care professional for regular check ups. Check your blood pressure as directed. Ask your doctor what your blood pressure should be, and when you should contact him or her. Do not treat yourself for coughs, colds, or pain while you are using this medicine without asking your doctor or health care professional for advice. Some  ingredients may increase your blood pressure. Women should inform their doctor if they wish to become pregnant or think they might be pregnant. There is a potential for serious side effects to an unborn child. Talk to your health care professional or pharmacist for more information. Check with your doctor or health care professional if you get an attack of severe diarrhea, nausea and vomiting, or if you sweat a lot. The loss of too much body fluid can make it dangerous for you to take this medicine. You may get drowsy or dizzy. Do not drive, use machinery, or do anything that needs mental alertness until you know how this drug affects you. Do not stand or sit up quickly, especially if you are an older patient. This reduces the risk of dizzy or fainting spells. Alcohol can make you more drowsy and dizzy. Avoid alcoholic drinks. Avoid salt substitutes unless you are told otherwise by your doctor or health care professional. What side effects may I notice from receiving this medicine? Side effects that you should report to your doctor or health care professional as soon as possible: -allergic reactions like skin rash, itching or hives, swelling  of the hands, feet, face, lips, throat, or tongue -breathing problems -signs and symptoms of kidney injury like trouble passing urine or change in the amount of urine -signs and symptoms of increased potassium like muscle weakness; chest pain; or fast, irregular heartbeat -signs and symptoms of liver injury like dark yellow or brown urine; general ill feeling or flu-like symptoms; light-colored stools; loss of appetite; nausea; right upper belly pain; unusually weak or tired; yellowing of the eyes or skin -signs and symptoms of low blood pressure like dizziness; feeling faint or lightheaded, falls; unusually weak or tired -stomach pain with or without nausea and vomiting Side effects that usually do not require medical attention (report to your doctor or health  care professional if they continue or are bothersome): -changes in taste -cough -dizziness -fever -headache -sensitivity to light This list may not describe all possible side effects. Call your doctor for medical advice about side effects. You may report side effects to FDA at 1-800-FDA-1088. Where should I keep my medicine? Keep out of the reach of children. Store at room temperature between 15 and 30 degrees C (59 and 86 degrees F). Protect from moisture. Keep container tightly closed. Throw away any unused medicine after the expiration date. NOTE: This sheet is a summary. It may not cover all possible information. If you have questions about this medicine, talk to your doctor, pharmacist, or health care provider.  2018 Elsevier/Gold Standard (2015-06-11 12:52:35)  Bupropion sustained-release tablets (smoking cessation) What is this medicine? BUPROPION (byoo PROE pee on) is used to help people quit smoking. This medicine may be used for other purposes; ask your health care provider or pharmacist if you have questions. COMMON BRAND NAME(S): Buproban, Zyban What should I tell my health care provider before I take this medicine? They need to know if you have any of these conditions: -an eating disorder, such as anorexia or bulimia -bipolar disorder or psychosis -diabetes or high blood sugar, treated with medication -glaucoma -head injury or brain tumor -heart disease, previous heart attack, or irregular heart beat -high blood pressure -kidney or liver disease -seizures -suicidal thoughts or a previous suicide attempt -Tourette's syndrome -weight loss -an unusual or allergic reaction to bupropion, other medicines, foods, dyes, or preservatives -breast-feeding -pregnant or trying to become pregnant How should I use this medicine? Take this medicine by mouth with a glass of water. Follow the directions on the prescription label. You can take it with or without food. If it upsets  your stomach, take it with food. Do not cut, crush or chew this medicine. Take your medicine at regular intervals. If you take this medicine more than once a day, take your second dose at least 8 hours after you take your first dose. To limit difficulty in sleeping, avoid taking this medicine at bedtime. Do not take your medicine more often than directed. Do not stop taking this medicine suddenly except upon the advice of your doctor. Stopping this medicine too quickly may cause serious side effects. A special MedGuide will be given to you by the pharmacist with each prescription and refill. Be sure to read this information carefully each time. Talk to your pediatrician regarding the use of this medicine in children. Special care may be needed. Overdosage: If you think you have taken too much of this medicine contact a poison control center or emergency room at once. NOTE: This medicine is only for you. Do not share this medicine with others. What if I miss a dose? If you  miss a dose, skip the missed dose and take your next tablet at the regular time. There should be at least 8 hours between doses. Do not take double or extra doses. What may interact with this medicine? Do not take this medicine with any of the following medications: -linezolid -MAOIs like Azilect, Carbex, Eldepryl, Marplan, Nardil, and Parnate -methylene blue (injected into a vein) -other medicines that contain bupropion like Wellbutrin This medicine may also interact with the following medications: -alcohol -certain medicines for anxiety or sleep -certain medicines for blood pressure like metoprolol, propranolol -certain medicines for depression or psychotic disturbances -certain medicines for HIV or AIDS like efavirenz, lopinavir, nelfinavir, ritonavir -certain medicines for irregular heart beat like propafenone, flecainide -certain medicines for Parkinson's disease like amantadine, levodopa -certain medicines for seizures  like carbamazepine, phenytoin, phenobarbital -cimetidine -clopidogrel -cyclophosphamide -digoxin -furazolidone -isoniazid -nicotine -orphenadrine -procarbazine -steroid medicines like prednisone or cortisone -stimulant medicines for attention disorders, weight loss, or to stay awake -tamoxifen -theophylline -thiotepa -ticlopidine -tramadol -warfarin This list may not describe all possible interactions. Give your health care provider a list of all the medicines, herbs, non-prescription drugs, or dietary supplements you use. Also tell them if you smoke, drink alcohol, or use illegal drugs. Some items may interact with your medicine. What should I watch for while using this medicine? Visit your doctor or health care professional for regular checks on your progress. This medicine should be used together with a patient support program. It is important to participate in a behavioral program, counseling, or other support program that is recommended by your health care professional. Patients and their families should watch out for new or worsening thoughts of suicide or depression. Also watch out for sudden changes in feelings such as feeling anxious, agitated, panicky, irritable, hostile, aggressive, impulsive, severely restless, overly excited and hyperactive, or not being able to sleep. If this happens, especially at the beginning of treatment or after a change in dose, call your health care professional. Avoid alcoholic drinks while taking this medicine. Drinking excessive alcoholic beverages, using sleeping or anxiety medicines, or quickly stopping the use of these agents while taking this medicine may increase your risk for a seizure. Do not drive or use heavy machinery until you know how this medicine affects you. This medicine can impair your ability to perform these tasks. Do not take this medicine close to bedtime. It may prevent you from sleeping. Your mouth may get dry. Chewing sugarless  gum or sucking hard candy, and drinking plenty of water may help. Contact your doctor if the problem does not go away or is severe. Do not use nicotine patches or chewing gum without the advice of your doctor or health care professional while taking this medicine. You may need to have your blood pressure taken regularly if your doctor recommends that you use both nicotine and this medicine together. What side effects may I notice from receiving this medicine? Side effects that you should report to your doctor or health care professional as soon as possible: -allergic reactions like skin rash, itching or hives, swelling of the face, lips, or tongue -breathing problems -changes in vision -confusion -elevated mood, decreased need for sleep, racing thoughts, impulsive behavior -fast or irregular heartbeat -hallucinations, loss of contact with reality -increased blood pressure -redness, blistering, peeling or loosening of the skin, including inside the mouth -seizures -suicidal thoughts or other mood changes -unusually weak or tired -vomiting Side effects that usually do not require medical attention (report to your doctor or  health care professional if they continue or are bothersome): -constipation -headache -loss of appetite -nausea -tremors -weight loss This list may not describe all possible side effects. Call your doctor for medical advice about side effects. You may report side effects to FDA at 1-800-FDA-1088. Where should I keep my medicine? Keep out of the reach of children. Store at room temperature between 20 and 25 degrees C (68 and 77 degrees F). Protect from light. Keep container tightly closed. Throw away any unused medicine after the expiration date. NOTE: This sheet is a summary. It may not cover all possible information. If you have questions about this medicine, talk to your doctor, pharmacist, or health care provider.  2018 Elsevier/Gold Standard (2015-10-12  13:49:28)

## 2016-10-13 ENCOUNTER — Encounter: Payer: Self-pay | Admitting: Gastroenterology

## 2016-10-28 ENCOUNTER — Ambulatory Visit: Payer: Medicare Other | Attending: Family Medicine | Admitting: Family Medicine

## 2016-10-28 VITALS — BP 115/79 | HR 58 | Temp 98.0°F | Resp 18 | Ht 63.0 in | Wt 122.2 lb

## 2016-10-28 DIAGNOSIS — F17210 Nicotine dependence, cigarettes, uncomplicated: Secondary | ICD-10-CM | POA: Diagnosis not present

## 2016-10-28 DIAGNOSIS — Z1211 Encounter for screening for malignant neoplasm of colon: Secondary | ICD-10-CM | POA: Diagnosis not present

## 2016-10-28 DIAGNOSIS — Z9889 Other specified postprocedural states: Secondary | ICD-10-CM | POA: Insufficient documentation

## 2016-10-28 DIAGNOSIS — R7303 Prediabetes: Secondary | ICD-10-CM | POA: Insufficient documentation

## 2016-10-28 DIAGNOSIS — I10 Essential (primary) hypertension: Secondary | ICD-10-CM | POA: Diagnosis not present

## 2016-10-28 DIAGNOSIS — Z823 Family history of stroke: Secondary | ICD-10-CM | POA: Insufficient documentation

## 2016-10-28 DIAGNOSIS — Z114 Encounter for screening for human immunodeficiency virus [HIV]: Secondary | ICD-10-CM | POA: Diagnosis not present

## 2016-10-28 DIAGNOSIS — Z1321 Encounter for screening for nutritional disorder: Secondary | ICD-10-CM | POA: Diagnosis not present

## 2016-10-28 DIAGNOSIS — Z131 Encounter for screening for diabetes mellitus: Secondary | ICD-10-CM | POA: Diagnosis not present

## 2016-10-28 DIAGNOSIS — Z1159 Encounter for screening for other viral diseases: Secondary | ICD-10-CM | POA: Insufficient documentation

## 2016-10-28 DIAGNOSIS — L298 Other pruritus: Secondary | ICD-10-CM | POA: Insufficient documentation

## 2016-10-28 DIAGNOSIS — R51 Headache: Secondary | ICD-10-CM | POA: Diagnosis not present

## 2016-10-28 DIAGNOSIS — W57XXXA Bitten or stung by nonvenomous insect and other nonvenomous arthropods, initial encounter: Secondary | ICD-10-CM | POA: Diagnosis not present

## 2016-10-28 DIAGNOSIS — Z9842 Cataract extraction status, left eye: Secondary | ICD-10-CM | POA: Insufficient documentation

## 2016-10-28 DIAGNOSIS — F17200 Nicotine dependence, unspecified, uncomplicated: Secondary | ICD-10-CM

## 2016-10-28 DIAGNOSIS — E785 Hyperlipidemia, unspecified: Secondary | ICD-10-CM | POA: Diagnosis not present

## 2016-10-28 DIAGNOSIS — Z9841 Cataract extraction status, right eye: Secondary | ICD-10-CM | POA: Insufficient documentation

## 2016-10-28 DIAGNOSIS — Z125 Encounter for screening for malignant neoplasm of prostate: Secondary | ICD-10-CM | POA: Diagnosis not present

## 2016-10-28 DIAGNOSIS — Z0000 Encounter for general adult medical examination without abnormal findings: Secondary | ICD-10-CM | POA: Insufficient documentation

## 2016-10-28 DIAGNOSIS — H269 Unspecified cataract: Secondary | ICD-10-CM | POA: Diagnosis not present

## 2016-10-28 DIAGNOSIS — Z1329 Encounter for screening for other suspected endocrine disorder: Secondary | ICD-10-CM | POA: Diagnosis not present

## 2016-10-28 DIAGNOSIS — Z130 Encounter for screening for diseases of the blood and blood-forming organs and certain disorders involving the immune mechanism: Secondary | ICD-10-CM | POA: Diagnosis not present

## 2016-10-28 LAB — POCT UA - MICROALBUMIN
Albumin/Creatinine Ratio, Urine, POC: <30
CREATININE, POC: 200 mg/dL
MICROALBUMIN (UR) POC: 10 mg/L

## 2016-10-28 LAB — POCT GLYCOSYLATED HEMOGLOBIN (HGB A1C): Hemoglobin A1C: 5.7

## 2016-10-28 MED ORDER — HYDROCORTISONE 2.5 % EX CREA: TOPICAL_CREAM | Freq: Two times a day (BID) | CUTANEOUS | 0 refills | Status: DC

## 2016-10-28 MED ORDER — BUPROPION HCL ER (SR) 150 MG PO TB12: 150.0000 mg | ORAL_TABLET | Freq: Two times a day (BID) | ORAL | 1 refills | Status: DC

## 2016-10-28 MED ORDER — DIPHENHYDRAMINE HCL 25 MG PO CAPS: 25.0000 mg | ORAL_CAPSULE | Freq: Four times a day (QID) | ORAL | 0 refills | Status: DC | PRN

## 2016-10-28 MED FILL — HYDROCORTISONE 2.5% CREAM: 2.5 | 15 days supply | Qty: 30 | Fill #0

## 2016-10-28 MED FILL — BUPROPION SR 150 MG TABLET: 150 | 30 days supply | Qty: 60 | Fill #0

## 2016-10-28 NOTE — Patient Instructions (Addendum)
Insect Bite, Adult An insect bite can make your skin red, itchy, and swollen. Some insects can spread disease to people with a bite. However, most insect bites do not lead to disease, and most are not serious. Follow these instructions at home: Bite area care  Do not scratch the bite area.  Keep the bite area clean and dry.  Wash the bite area every day with soap and water as told by your doctor.  Check the bite area every day for signs of infection. Check for: ? More redness, swelling, or pain. ? Fluid or blood. ? Warmth. ? Pus. Managing pain, itching, and swelling  You may put any of these on the bite area as told by your doctor: ? A baking soda paste. ? Cortisone cream. ? Calamine lotion.  If directed, put ice on the bite area. ? Put ice in a plastic bag. ? Place a towel between your skin and the bag. ? Leave the ice on for 20 minutes, 2-3 times a day. Medicines  Take medicines or put medicines on your skin only as told by your doctor.  If you were prescribed an antibiotic medicine, use it as told by your doctor. Do not stop using the antibiotic even if your condition improves. General instructions  Keep all follow-up visits as told by your doctor. This is important. How is this prevented? To help you have a lower risk of insect bites:  When you are outside, wear clothing that covers your arms and legs.  Use insect repellent. The best insect repellents have: ? An active ingredient of DEET, picaridin, oil of lemon eucalyptus (OLE), or IR3535. ? Higher amounts of DEET or another active ingredient than other repellents have.  If your home windows do not have screens, think about putting some in.  Contact a doctor if:  You have more redness, swelling, or pain in the bite area.  You have fluid, blood, or pus coming from the bite area.  The bite area feels warm.  You have a fever. Get help right away if:  You have joint pain.  You have a rash.  You have  shortness of breath.  You feel more tired or sleepy than you normally do.  You have neck pain.  You have a headache.  You feel weaker than you normally do.  You have chest pain.  You have pain in your belly.  You feel sick to your stomach (nauseous) or you throw up (vomit). Summary  An insect bite can make your skin red, itchy, and swollen.  Do not scratch the bite area, and keep it clean and dry.  Ice can help with pain and itching from the bite. This information is not intended to replace advice given to you by your health care provider. Make sure you discuss any questions you have with your health care provider. Document Released: 04/18/2000 Document Revised: 11/22/2015 Document Reviewed: 09/06/2014 Elsevier Interactive Patient Education  2018 [employer]   Prediabetes Prediabetes is the condition of having a blood sugar (blood glucose) level that is higher than it should be, but not high enough for you to be diagnosed with type 2 diabetes. Having prediabetes puts you at risk for developing type 2 diabetes (type 2 diabetes mellitus). Prediabetes may be called impaired glucose tolerance or impaired fasting glucose. Prediabetes usually does not cause symptoms. Your health care provider can diagnose this condition with blood tests. You may be tested for prediabetes if you are overweight and if you have at  least one other risk factor for prediabetes. Risk factors for prediabetes include:  Having a family member with type 2 diabetes.  Being overweight or obese.  Being older than age 55.  Being of American-Indian, African-American, Hispanic/Latino, or Asian/Pacific Islander descent.  Having an inactive (sedentary) lifestyle.  Having a history of gestational diabetes or polycystic ovarian syndrome (PCOS).  Having low levels of good cholesterol (HDL-C) or high levels of blood fats (triglycerides).  Having high blood pressure.  What is blood glucose and how is blood  glucose measured?  Blood glucose refers to the amount of glucose in your bloodstream. Glucose comes from eating foods that contain sugars and starches (carbohydrates) that the body breaks down into glucose. Your blood glucose level may be measured in mg/dL (milligrams per deciliter) or mmol/L (millimoles per liter).Your blood glucose may be checked with one or more of the following blood tests:  A fasting blood glucose (FBG) test. You will not be allowed to eat (you will fast) for at least 8 hours before a blood sample is taken. ? A normal range for FBG is 70-100 mg/dl (1.6-1.0 mmol/L).  An A1c (hemoglobin A1c) blood test. This test provides information about blood glucose control over the previous 2?3months.  An oral glucose tolerance test (OGTT). This test measures your blood glucose twice: ? After fasting. This is your baseline level. ? Two hours after you drink a beverage that contains glucose.  You may be diagnosed with prediabetes:  If your FBG is 100?125 mg/dL (9.6-0.4 mmol/L).  If your A1c level is 5.7?6.4%.  If your OGGT result is 140?199 mg/dL (5.4-09 mmol/L).  These blood tests may be repeated to confirm your diagnosis. What happens if blood glucose is too high? The pancreas produces a hormone (insulin) that helps move glucose from the bloodstream into cells. When cells in the body do not respond properly to insulin that the body makes (insulin resistance), excess glucose builds up in the blood instead of going into cells. As a result, high blood glucose (hyperglycemia) can develop, which can cause many complications. This is a symptom of prediabetes. What can happen if blood glucose stays higher than normal for a long time? Having high blood glucose for a long time is dangerous. Too much glucose in your blood can damage your nerves and blood vessels. Long-term damage can lead to complications from diabetes, which may include:  Heart disease.  Stroke.  Blindness.  Kidney  disease.  Depression.  Poor circulation in the feet and legs, which could lead to surgical removal (amputation) in severe cases.  How can prediabetes be prevented from turning into type 2 diabetes?  To help prevent type 2 diabetes, take the following actions:  Be physically active. ? Do moderate-intensity physical activity for at least 30 minutes on at least 5 days of the week, or as much as told by your health care provider. This could be brisk walking, biking, or water aerobics. ? Ask your health care provider what activities are safe for you. A mix of physical activities may be best, such as walking, swimming, cycling, and strength training.  Lose weight as told by your health care provider. ? Losing 5-7% of your body weight can reverse insulin resistance. ? Your health care provider can determine how much weight loss is best for you and can help you lose weight safely.  Follow a healthy meal plan. This includes eating lean proteins, complex carbohydrates, fresh fruits and vegetables, low-fat dairy products, and healthy fats. ? Follow instructions  from your health care provider about eating or drinking restrictions. ? Make an appointment to see a diet and nutrition specialist (registered dietitian) to help you create a healthy eating plan that is right for you.  Do not smoke or use any tobacco products, such as cigarettes, chewing tobacco, and e-cigarettes. If you need help quitting, ask your health care provider.  Take over-the-counter and prescription medicines as told by your health care provider. You may be prescribed medicines that help lower the risk of type 2 diabetes.  This information is not intended to replace advice given to you by your health care provider. Make sure you discuss any questions you have with your health care provider. Document Released: 08/13/2015 Document Revised: 09/27/2015 Document Reviewed: 06/12/2015 Elsevier Interactive Patient Education  [employer].
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
## 2016-10-29 LAB — CBC WITH DIFFERENTIAL/PLATELET
BASOS ABS: 0.1 10*3/uL (ref 0.0–0.2)
Basos: 0 %
EOS (ABSOLUTE): 0.3 10*3/uL (ref 0.0–0.4)
Eos: 2 %
HEMOGLOBIN: 16.6 g/dL (ref 13.0–17.7)
Hematocrit: 49.7 % (ref 37.5–51.0)
Immature Grans (Abs): 0 10*3/uL (ref 0.0–0.1)
Immature Granulocytes: 0 %
LYMPHS: 17 %
Lymphocytes Absolute: 2.1 10*3/uL (ref 0.7–3.1)
MCH: 26.9 pg (ref 26.6–33.0)
MCHC: 33.4 g/dL (ref 31.5–35.7)
MCV: 81 fL (ref 79–97)
MONOCYTES: 6 %
Monocytes Absolute: 0.7 10*3/uL (ref 0.1–0.9)
NEUTROS PCT: 75 %
Neutrophils Absolute: 9.1 10*3/uL — ABNORMAL HIGH (ref 1.4–7.0)
Platelets: 196 10*3/uL (ref 150–379)
RBC: 6.17 x10E6/uL — AB (ref 4.14–5.80)
RDW: 14.9 % (ref 12.3–15.4)
WBC: 12.2 10*3/uL — AB (ref 3.4–10.8)

## 2016-10-29 LAB — CMP14+EGFR
ALK PHOS: 94 IU/L (ref 39–117)
ALT: 29 IU/L (ref 0–44)
AST: 23 IU/L (ref 0–40)
Albumin/Globulin Ratio: 1.7 (ref 1.2–2.2)
Albumin: 4.7 g/dL (ref 3.5–5.5)
BILIRUBIN TOTAL: 0.3 mg/dL (ref 0.0–1.2)
BUN/Creatinine Ratio: 12 (ref 9–20)
BUN: 13 mg/dL (ref 6–24)
CO2: 23 mmol/L (ref 20–29)
Calcium: 10 mg/dL (ref 8.7–10.2)
Chloride: 100 mmol/L (ref 96–106)
Creatinine, Ser: 1.07 mg/dL (ref 0.76–1.27)
GFR calc Af Amer: 89 mL/min/{1.73_m2} (ref >59)
GFR calc non Af Amer: 77 mL/min/{1.73_m2} (ref >59)
GLUCOSE: 80 mg/dL (ref 65–99)
Globulin, Total: 2.7 g/dL (ref 1.5–4.5)
Potassium: 4.9 mmol/L (ref 3.5–5.2)
Sodium: 140 mmol/L (ref 134–144)
Total Protein: 7.4 g/dL (ref 6.0–8.5)

## 2016-10-29 LAB — LIPID PANEL
CHOLESTEROL TOTAL: 150 mg/dL (ref 100–199)
Chol/HDL Ratio: 2.8 ratio (ref 0.0–5.0)
HDL: 53 mg/dL (ref >39)
LDL Calculated: 68 mg/dL (ref 0–99)
TRIGLYCERIDES: 143 mg/dL (ref 0–149)
VLDL Cholesterol Cal: 29 mg/dL (ref 5–40)

## 2016-10-29 LAB — HIV ANTIBODY (ROUTINE TESTING W REFLEX): HIV SCREEN 4TH GENERATION: NONREACTIVE

## 2016-10-29 LAB — TSH: TSH: 1.38 u[IU]/mL (ref 0.450–4.500)

## 2016-10-29 LAB — HEPATITIS C ANTIBODY: Hep C Virus Ab: <0.1 s/co ratio (ref 0.0–0.9)

## 2016-10-29 LAB — VITAMIN D 25 HYDROXY (VIT D DEFICIENCY, FRACTURES): Vit D, 25-Hydroxy: 13.8 ng/mL — ABNORMAL LOW (ref 30.0–100.0)

## 2016-10-29 LAB — PSA: PROSTATE SPECIFIC AG, SERUM: 1.3 ng/mL (ref 0.0–4.0)

## 2016-11-03 ENCOUNTER — Other Ambulatory Visit: Payer: Self-pay | Admitting: Family Medicine

## 2016-11-03 DIAGNOSIS — D72829 Elevated white blood cell count, unspecified: Secondary | ICD-10-CM

## 2016-11-03 DIAGNOSIS — E559 Vitamin D deficiency, unspecified: Secondary | ICD-10-CM

## 2016-11-03 MED ORDER — VITAMIN D (ERGOCALCIFEROL) 1.25 MG (50000 UNIT) PO CAPS: 50000.0000 [IU] | ORAL_CAPSULE | ORAL | 0 refills | Status: AC

## 2016-11-03 MED FILL — VIT D2 1.25 MG (50,000 UNIT: 1.25 MG | 28 days supply | Qty: 4 | Fill #0

## 2016-11-06 ENCOUNTER — Telehealth: Payer: Self-pay

## 2016-11-06 NOTE — Telephone Encounter (Signed)
-----   Message from Lizbeth BarkMandesia R Hairston, FNP sent at 11/03/2016  1:16 PM EDT ----- White blood cells count elevated. This can occur with acute inflammation or infection. Recommend repeat CBC labs in two week to re-evaluate.  Kidney function normal  Liver function normal  Lipid levels normal  PSA is normal. PSA is screens for prostate problems  Hepatitis C is negative.  HIV negative  Vitamin D level was low. Vitamin D helps to keep bones strong. You were prescribed ergocalciferol (capsules) to increase your vitamin-d level. Once finished start taking OTC vitamin d supplement with 800 international units (IU) of vitamin-d per day. Recommend follow up in 4 months.

## 2016-11-06 NOTE — Telephone Encounter (Signed)
CMA call regarding results   Patient Verify DOB   Patient was aware and understood  

## 2016-11-28 MED FILL — VIT D2 1.25 MG (50,000 UNIT: 1.25 MG | 28 days supply | Qty: 4 | Fill #1

## 2016-12-12 ENCOUNTER — Encounter: Payer: Medicare Other | Admitting: Gastroenterology

## 2018-03-01 ENCOUNTER — Other Ambulatory Visit: Payer: Self-pay

## 2018-03-01 ENCOUNTER — Encounter: Payer: Self-pay | Admitting: Family Medicine

## 2018-03-01 ENCOUNTER — Ambulatory Visit: Payer: Medicare Other | Attending: Family Medicine | Admitting: Family Medicine

## 2018-03-01 VITALS — BP 157/80 | HR 71 | Temp 97.7°F | Resp 18 | Ht 63.0 in | Wt 128.4 lb

## 2018-03-01 DIAGNOSIS — Z79899 Other long term (current) drug therapy: Secondary | ICD-10-CM | POA: Diagnosis not present

## 2018-03-01 DIAGNOSIS — F17210 Nicotine dependence, cigarettes, uncomplicated: Secondary | ICD-10-CM | POA: Diagnosis not present

## 2018-03-01 DIAGNOSIS — M5412 Radiculopathy, cervical region: Secondary | ICD-10-CM

## 2018-03-01 DIAGNOSIS — Z9889 Other specified postprocedural states: Secondary | ICD-10-CM | POA: Insufficient documentation

## 2018-03-01 DIAGNOSIS — I10 Essential (primary) hypertension: Secondary | ICD-10-CM

## 2018-03-01 DIAGNOSIS — Z23 Encounter for immunization: Secondary | ICD-10-CM

## 2018-03-01 MED ORDER — AMLODIPINE BESYLATE 5 MG PO TABS: 5.0000 mg | ORAL_TABLET | Freq: Every day | ORAL | 11 refills | Status: DC

## 2018-03-01 MED ORDER — TRAMADOL HCL 50 MG PO TABS: 50.0000 mg | ORAL_TABLET | Freq: Three times a day (TID) | ORAL | 1 refills | Status: DC | PRN

## 2018-03-01 MED FILL — traMADol HCL 50 MG TABS: 50 | 7 days supply | Qty: 21 | Fill #0

## 2018-03-01 MED FILL — AMLODIPINE BESYLATE 5 MG TA: 5 | 90 days supply | Qty: 90 | Fill #0

## 2018-03-01 NOTE — Progress Notes (Signed)
Flu: no Pain: left side , 6   Out of all meds, had ran out, 1year  Requested to speak with socal worker regarding resources
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
## 2018-03-17 ENCOUNTER — Telehealth: Payer: Self-pay | Admitting: Licensed Clinical Social Worker
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
## 2018-03-29 ENCOUNTER — Encounter: Payer: Self-pay | Admitting: Pharmacist

## 2018-03-29 ENCOUNTER — Ambulatory Visit: Payer: Medicare Other | Attending: Family Medicine | Admitting: Pharmacist

## 2018-03-29 VITALS — BP 106/65 | HR 67

## 2018-03-29 DIAGNOSIS — I10 Essential (primary) hypertension: Secondary | ICD-10-CM

## 2018-03-29 DIAGNOSIS — Z7901 Long term (current) use of anticoagulants: Secondary | ICD-10-CM | POA: Insufficient documentation

## 2018-03-29 DIAGNOSIS — F17200 Nicotine dependence, unspecified, uncomplicated: Secondary | ICD-10-CM | POA: Diagnosis not present

## 2018-03-29 NOTE — Patient Instructions (Signed)

## 2018-03-29 NOTE — Progress Notes (Signed)
   S:    PCP: Dr. Jillyn HiddenFulp  Patient arrives in good spirits. Presents to the clinic for hypertension management. Patient was referred by Dr. Jillyn HiddenFulp on 03/01/18. BP at that visit 157/80.  Amlodipine 5 mg daily started.   Today, pt endorses occasional HA. Denies chest pain, shortness of breath, BLE edema or dizziness.   Patient reports adherence with medications.  Current BP Medications include:  Amlodipine 5 mg   Antihypertensives tried in the past include: NKDA, amlodipine 10 mg, lisinopril 5 mg   Dietary habits include: does not limit salt, drinks 2-3 cups of coffee per day Exercise habits include: does not exercise Family / Social history: no pertinent fhx, smokes 1 PPD, drinks beer occasionally   ASCVD risk factors include: HTN, tobacco dependence  Home BP readings: does not check at home; in the process of obtaining BP cuff  O:  L arm after 5 minutes rest: 106/65, HR 67  Last 3 Office BP readings: BP Readings from Last 3 Encounters:  03/01/18 (!) 157/80  10/28/16 115/79  10/09/16 120/77   BMET    Component Value Date/Time   NA 140 10/28/2016 0957   K 4.9 10/28/2016 0957   CL 100 10/28/2016 0957   CO2 23 10/28/2016 0957   GLUCOSE 80 10/28/2016 0957   GLUCOSE 92 06/17/2016 1011   BUN 13 10/28/2016 0957   CREATININE 1.07 10/28/2016 0957   CREATININE 0.92 06/17/2016 1011   CALCIUM 10.0 10/28/2016 0957   GFRNONAA 77 10/28/2016 0957   GFRNONAA >89 06/17/2016 1011   GFRAA 89 10/28/2016 0957   GFRAA >89 06/17/2016 1011    Renal function: CrCl cannot be calculated (Patient's most recent lab result is older than the maximum 21 days allowed.).  A/P: Hypertension longstandingcurrently controlled on current medications. BP Goal <130/80 mmHg. Patient is adherent with current medications. With patient's age, race, smoking status, and hypertension, will obtain updated lipid panel and CMP. -Continued amlodipine 5 mg daily.  -F/u labs ordered - lipid, CMP -HM: flu and PNa  offered, pt refused  -Counseled on lifestyle modifications for blood pressure control including reduced dietary sodium, increased exercise, adequate sleep  Results reviewed and written information provided. Total time in face-to-face counseling 15 minutes.   F/U Clinic Visit in 1 month.    Butch PennyLuke Van Ausdall, PharmD, CPP Clinical Pharmacist [facility] [phone]

## 2018-03-30 LAB — CMP14+EGFR
ALT: 58 IU/L — ABNORMAL HIGH (ref 0–44)
AST: 38 IU/L (ref 0–40)
Albumin/Globulin Ratio: 2 (ref 1.2–2.2)
Albumin: 4.3 g/dL (ref 3.5–5.5)
Alkaline Phosphatase: 85 IU/L (ref 39–117)
BUN/Creatinine Ratio: 19 (ref 9–20)
BUN: 17 mg/dL (ref 6–24)
Bilirubin Total: <0.2 mg/dL (ref 0.0–1.2)
CO2: 23 mmol/L (ref 20–29)
Calcium: 9.2 mg/dL (ref 8.7–10.2)
Chloride: 104 mmol/L (ref 96–106)
Creatinine, Ser: 0.88 mg/dL (ref 0.76–1.27)
GFR calc Af Amer: 109 mL/min/1.73
GFR calc non Af Amer: 94 mL/min/1.73
Globulin, Total: 2.2 g/dL (ref 1.5–4.5)
Glucose: 87 mg/dL (ref 65–99)
Potassium: 3.9 mmol/L (ref 3.5–5.2)
Sodium: 141 mmol/L (ref 134–144)
Total Protein: 6.5 g/dL (ref 6.0–8.5)

## 2018-03-30 LAB — LIPID PANEL
Chol/HDL Ratio: 3 ratio (ref 0.0–5.0)
Cholesterol, Total: 154 mg/dL (ref 100–199)
HDL: 52 mg/dL
LDL Calculated: 67 mg/dL (ref 0–99)
Triglycerides: 173 mg/dL — ABNORMAL HIGH (ref 0–149)
VLDL Cholesterol Cal: 35 mg/dL (ref 5–40)

## 2018-03-31 ENCOUNTER — Telehealth (INDEPENDENT_AMBULATORY_CARE_PROVIDER_SITE_OTHER): Payer: Self-pay

## 2018-03-31 NOTE — Telephone Encounter (Signed)
-----   Message from Cain Saupeammie Fulp, MD sent at 03/30/2018  6:13 PM EST ----- Please notify patient that his lipid panel shows that his LDL or bad cholesterol is within the normal range at 67.  Patient has a mild increase in triglycerides at 173 with a goal triglyceride level of 149 or less.  Patient should follow a low-fat diet and avoid fried/greasy foods.  Patient with a normal CMP with the exception of a mild increase in a liver enzyme, ALT which is at 58 with normal of 0-44.  Patient should try to limit any use of alcohol or Tylenol/acetaminophen and test will continue to be followed at a future visit

## 2018-03-31 NOTE — Telephone Encounter (Signed)
Patient verified DOB. Patient aware of results per Dr. Jillyn HiddenFulp. Maryjean Mornempestt S , CMA

## 2018-04-29 ENCOUNTER — Ambulatory Visit: Payer: Medicare Other | Attending: Family Medicine | Admitting: Pharmacist

## 2018-04-29 VITALS — BP 111/70 | HR 74

## 2018-04-29 DIAGNOSIS — F17200 Nicotine dependence, unspecified, uncomplicated: Secondary | ICD-10-CM | POA: Diagnosis not present

## 2018-04-29 DIAGNOSIS — I10 Essential (primary) hypertension: Secondary | ICD-10-CM | POA: Insufficient documentation

## 2018-04-29 DIAGNOSIS — Z23 Encounter for immunization: Secondary | ICD-10-CM

## 2018-04-29 MED ORDER — PNEUMOCOCCAL VAC POLYVALENT 25 MCG/0.5ML IJ INJ
0.5000 mL | INJECTION | Freq: Once | INTRAMUSCULAR | Status: AC
  Administered 2018-04-29: 0.5 mL via INTRAMUSCULAR

## 2018-04-29 MED FILL — traMADol HCL 50 MG TABS: 50 | 7 days supply | Qty: 21 | Fill #1

## 2018-04-29 NOTE — Progress Notes (Signed)
   S:    PCP: Dr. Jillyn HiddenFulp  Patient arrives in good spirits. Presents to the clinic for hypertension management. Patient was referred by Dr. Jillyn HiddenFulp on 03/01/18. I last saw him on 03/29/18 and made no changes d/t goal BP.  Today, pt denies chest pain, shortness of breath, BLE edema or dizziness.   Patient reports adherence with medications.  Current BP Medications include:  Amlodipine 5 mg   Antihypertensives tried in the past include: NKDA, amlodipine 10 mg, lisinopril 5 mg   Dietary habits include: does not limit salt, drinks 2-3 cups of coffee per day Exercise habits include: does not exercise Family / Social history: no pertinent fhx, smokes 1 PPD, drinks beer occasionally   ASCVD risk factors include: HTN, tobacco dependence  Home BP readings: does not check at home; in the process of obtaining BP cuff  O:  L arm after 5 minutes rest: 111/70, HR 74  Last 3 Office BP readings: BP Readings from Last 3 Encounters:  03/29/18 106/65  03/01/18 (!) 157/80  10/28/16 115/79   BMET    Component Value Date/Time   NA 141 03/29/2018 1615   K 3.9 03/29/2018 1615   CL 104 03/29/2018 1615   CO2 23 03/29/2018 1615   GLUCOSE 87 03/29/2018 1615   GLUCOSE 92 06/17/2016 1011   BUN 17 03/29/2018 1615   CREATININE 0.88 03/29/2018 1615   CREATININE 0.92 06/17/2016 1011   CALCIUM 9.2 03/29/2018 1615   GFRNONAA 94 03/29/2018 1615   GFRNONAA >89 06/17/2016 1011   GFRAA 109 03/29/2018 1615   GFRAA >89 06/17/2016 1011    Renal function: CrCl cannot be calculated (Patient's most recent lab result is older than the maximum 21 days allowed.).  A/P: Hypertension longstanding currently controlled on current medications. BP Goal <130/80 mmHg. Patient is adherent with current medications.   -Continued amlodipine 5 mg daily.  -Counseled on lifestyle modifications for blood pressure control including reduced dietary sodium, increased exercise, adequate sleep  Health Maintenance: Fluarix due,  Pneumovax recommended with smoking status/hx. - Fluarix, Pneumovax given    Results reviewed and written information provided. Total time in face-to-face counseling 15 minutes.   F/U Clinic Visit w/ Dr. Jillyn HiddenFulp 07/02/17.  Butch PennyLuke Van Ausdall, PharmD, CPP Clinical Pharmacist [facility] [phone]

## 2018-04-29 NOTE — Patient Instructions (Addendum)
Thank you for coming to see [name] [name] today.   Blood pressure today is well-controlled!  Continue taking amlodipine. Take 1 tablet every day.    Limiting salt and caffeine, as well as exercising as able for at least 30 minutes for 5 days out of the week, can also help you lower your blood pressure.  Take your blood pressure at home if you are able. Please write down these numbers and bring them to your visits.  If you have any questions about medications, please call me [phone].  [name] [name]

## 2018-06-01 MED FILL — AMLODIPINE BESYLATE 5 MG TA: 5 | 90 days supply | Qty: 90 | Fill #1

## 2018-06-01 MED FILL — traMADol HCL 50 MG TABS: 50 | 7 days supply | Qty: 21 | Fill #2

## 2018-07-02 ENCOUNTER — Ambulatory Visit: Payer: Medicare Other | Attending: Family Medicine | Admitting: Family Medicine

## 2018-07-02 ENCOUNTER — Encounter: Payer: Self-pay | Admitting: Family Medicine

## 2018-07-02 VITALS — BP 121/74 | HR 76 | Temp 97.9°F | Resp 18 | Ht 62.0 in | Wt 127.0 lb

## 2018-07-02 DIAGNOSIS — I10 Essential (primary) hypertension: Secondary | ICD-10-CM

## 2018-07-02 DIAGNOSIS — F17200 Nicotine dependence, unspecified, uncomplicated: Secondary | ICD-10-CM | POA: Diagnosis not present

## 2018-07-02 DIAGNOSIS — M5412 Radiculopathy, cervical region: Secondary | ICD-10-CM

## 2018-07-02 DIAGNOSIS — F17210 Nicotine dependence, cigarettes, uncomplicated: Secondary | ICD-10-CM | POA: Insufficient documentation

## 2018-07-02 DIAGNOSIS — K047 Periapical abscess without sinus: Secondary | ICD-10-CM

## 2018-07-02 DIAGNOSIS — Z79899 Other long term (current) drug therapy: Secondary | ICD-10-CM | POA: Insufficient documentation

## 2018-07-02 MED ORDER — GABAPENTIN 100 MG PO CAPS: ORAL_CAPSULE | ORAL | 11 refills | Status: DC

## 2018-07-02 MED ORDER — AMLODIPINE BESYLATE 5 MG PO TABS: 5.0000 mg | ORAL_TABLET | Freq: Every day | ORAL | 11 refills | Status: DC

## 2018-07-02 MED ORDER — AMOXICILLIN 500 MG PO CAPS: 500.0000 mg | ORAL_CAPSULE | Freq: Two times a day (BID) | ORAL | 1 refills | Status: AC

## 2018-07-02 MED FILL — AMOXICILLIN 500 MG CAPSULE: 500 | 7 days supply | Qty: 14 | Fill #0

## 2018-07-02 MED FILL — traMADol HCL 50 MG TABS: 50 | 7 days supply | Qty: 21 | Fill #3

## 2018-07-02 MED FILL — GABAPENTIN 100 MG CAP: 100 | 30 days supply | Qty: 60 | Fill #0
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
## 2018-07-03 ENCOUNTER — Encounter: Payer: Self-pay | Admitting: Family Medicine

## 2018-07-19 ENCOUNTER — Ambulatory Visit: Payer: Medicare Other | Attending: Family Medicine | Admitting: Pharmacist

## 2018-07-19 ENCOUNTER — Other Ambulatory Visit: Payer: Self-pay

## 2018-07-19 ENCOUNTER — Encounter: Payer: Self-pay | Admitting: Pharmacist

## 2018-07-19 DIAGNOSIS — Z716 Tobacco abuse counseling: Secondary | ICD-10-CM

## 2018-07-19 DIAGNOSIS — F17210 Nicotine dependence, cigarettes, uncomplicated: Secondary | ICD-10-CM | POA: Diagnosis not present

## 2018-07-19 MED ORDER — VARENICLINE TARTRATE 1 MG PO TABS: 1.0000 mg | ORAL_TABLET | Freq: Two times a day (BID) | ORAL | 1 refills | Status: DC

## 2018-07-19 MED ORDER — VARENICLINE TARTRATE 0.5 MG PO TABS: ORAL_TABLET | ORAL | 0 refills | Status: DC

## 2018-07-19 MED FILL — CHANTIX 0.5 MG TABLET: 0.5 | 7 days supply | Qty: 11 | Fill #0

## 2018-07-19 MED FILL — CHANTIX 1 MG CONT MONTH BOX: 1 | 28 days supply | Qty: 56 | Fill #0

## 2018-07-19 MED FILL — AMOXICILLIN 500 MG CAPSULE: 500 | 7 days supply | Qty: 14 | Fill #1

## 2018-07-19 NOTE — Progress Notes (Signed)
S:  Patient was reviewed by clinical pharmacist for assistance with tobacco cessation.   Tobacco Use History  Age when started using tobacco on a daily basis 58 YO.  Type: cigarettes.  Number of cigarettes per day 10-20  Estimated nicotine content: 0.8-1.2 mg per day.   Smokes first cigarette upon awakening.  Does wake at night to smoke  Fagerstrom Score 8/10.  Triggers include physical: wakes to smoke, smokes after eating.  Quit Attempt History   Most recent quit attempt 2018.  Longest time ever been tobacco free several weeks.  Methods tried in the past include Bupropion (Zyban), NRT via patch.   Rates IMPORTANCE of quitting tobacco on 1-10 scale of 10.  Rates READINESS of quitting tobacco on 1-10 scale of 10.  Rates CONFIDENCE of quitting tobacco on 1-10 scale of 5.  Motivators to quitting include cost, personal health; barriers include having the habit for so long   A/P: Nicotine dependence: severe , 47 years duration in a patient who is a fair candidate for success b/c of his readiness and recognition of improved health with cessation.   Reviewed options and patient reports interest in Chantix. Initiated Varenicline (Chantix). Treatment was reviewed with the patient, including name, instructions, goals of therapy, potential side effects, importance of adherence, and safe use.  Reviewed potential challenges and coping skills/strategies with patient. Provided information on 1 800-QUIT NOW support program and advised patient to contact me if questions/concerns arise. Patient verbalized understanding of information by repeating back.  Follow up in 1 month(s)  Horald Pollen Ausdall 2:27 PM 07/19/2018

## 2018-07-19 NOTE — Patient Instructions (Signed)
Thank you for coming to see [place] today.   Please start Chantix.   Take 0.5 mg: 1 tablet per day for 3 days.  Then increase to 0.5 mg: 1 tablet 2 times a day for 4 days.  Then increase to 1 mg: 1 tablet 2 times a day thereafter.   If you have any questions about medications, please call me [phone].  [name] [name]

## 2018-08-19 ENCOUNTER — Encounter: Payer: Self-pay | Admitting: Pharmacist

## 2018-08-19 ENCOUNTER — Other Ambulatory Visit: Payer: Self-pay

## 2018-08-19 ENCOUNTER — Ambulatory Visit: Payer: Medicare Other | Attending: Family Medicine | Admitting: Pharmacist

## 2018-08-19 DIAGNOSIS — Z716 Tobacco abuse counseling: Secondary | ICD-10-CM

## 2018-08-19 DIAGNOSIS — F17210 Nicotine dependence, cigarettes, uncomplicated: Secondary | ICD-10-CM | POA: Insufficient documentation

## 2018-08-19 MED ORDER — NICOTINE POLACRILEX 4 MG MT LOZG: 4.0000 mg | LOZENGE | OROMUCOSAL | 0 refills | Status: DC | PRN

## 2018-08-19 MED ORDER — NICOTINE 21 MG/24HR TD PT24: 21.0000 mg | MEDICATED_PATCH | Freq: Every day | TRANSDERMAL | 0 refills | Status: DC

## 2018-08-19 MED FILL — SM NICOTINE 4 MG LOZENGE: 4 | 30 days supply | Qty: 72 | Fill #0

## 2018-08-19 MED FILL — NICOTINE 21 MG/24HR PATCH: 21 | 42 days supply | Qty: 42 | Fill #0

## 2018-08-19 NOTE — Progress Notes (Signed)
S:  Patient was reviewed by clinical pharmacist for assistance with tobacco cessation.   Tobacco Use History  Age when started using tobacco on a daily basis 58 YO.  Type: cigarettes.  Number of cigarettes per day 10-20  Estimated nicotine content: 0.8-1.2 mg per day.   Smokes first cigarette upon awakening.  Does wake at night to smoke  Fagerstrom Score 8/10.  Triggers include physical: wakes to smoke, smokes after eating.  Quit Attempt History   Most recent quit attempt 2018.  Longest time ever been tobacco free: several weeks.  Methods tried in the past include Bupropion (Zyban), NRT via patch, Chantix   Rates IMPORTANCE of quitting tobacco on 1-10 scale of 10.  Rates READINESS of quitting tobacco on 1-10 scale of 10.  Rates CONFIDENCE of quitting tobacco on 1-10 scale of 5.  Motivators to quitting include cost, personal health; barriers include having the habit for so long   A/P: Nicotine dependence: severe, 47 years duration in a patient who is a fair candidate for success b/c of his readiness and recognition of improved health with cessation.   Reviewed options. Pt did not tolerate Chantix. ~3 days after initiation he developed nausea and abnormal thoughts that resolved after discontinuation. He has tried and failed Zyban and NRT before. Upon further questioning, he reveals he did not try the Step 1 or 21 mg/day patch. Will try this daily x6 weeks with 4mg  lozenges for cravings.Treatment was reviewed with the patient, including name, instructions, goals of therapy, potential side effects, importance of adherence, and safe use.  Reviewed potential challenges and coping skills/strategies with patient. Provided information on 1 800-QUIT NOW support program and advised patient to contact me if questions/concerns arise. Patient verbalized understanding of information by repeating back.  Follow up 6 weeks.  Butch Penny, PharmD, CPP Clinical Pharmacist [facility] [phone]

## 2018-08-19 NOTE — Patient Instructions (Signed)
Thank you for coming to see me today.   Stop Chantix.  We will try you on the nicotine patch. Use 1 patch a day. On the next day, take that patch off and apply a new patch. Do not apply to the same area.   For breakthrough cravings, use the nicotine lozenge. Place the lozenge in the mouth. Suck on the lozenge until it is completely dissolved. Do not swallow the lozenge. Follow the directions carefully that come with the lozenge. Use exactly as directed. Do not use the lozenges more often than directed.  For the first 6 weeks: use 1 lozenge every 1 to 2 hours when the craving occurs. Do not use more than 20 lozenges in 1 day.

## 2018-09-30 ENCOUNTER — Ambulatory Visit: Payer: Medicare Other | Admitting: Pharmacist

## 2018-10-22 ENCOUNTER — Ambulatory Visit: Payer: Medicare Other | Attending: Family Medicine | Admitting: Family Medicine

## 2018-10-22 ENCOUNTER — Encounter: Payer: Self-pay | Admitting: Family Medicine

## 2018-10-22 ENCOUNTER — Other Ambulatory Visit: Payer: Self-pay

## 2018-10-22 VITALS — BP 135/86 | HR 71 | Temp 98.0°F | Ht 62.0 in | Wt 126.0 lb

## 2018-10-22 DIAGNOSIS — R1013 Epigastric pain: Secondary | ICD-10-CM | POA: Diagnosis not present

## 2018-10-22 DIAGNOSIS — K219 Gastro-esophageal reflux disease without esophagitis: Secondary | ICD-10-CM | POA: Diagnosis not present

## 2018-10-22 DIAGNOSIS — M5412 Radiculopathy, cervical region: Secondary | ICD-10-CM | POA: Diagnosis not present

## 2018-10-22 DIAGNOSIS — K0889 Other specified disorders of teeth and supporting structures: Secondary | ICD-10-CM

## 2018-10-22 DIAGNOSIS — I10 Essential (primary) hypertension: Secondary | ICD-10-CM | POA: Diagnosis not present

## 2018-10-22 MED ORDER — TRAMADOL HCL 50 MG PO TABS: 50.0000 mg | ORAL_TABLET | Freq: Three times a day (TID) | ORAL | 1 refills | Status: DC | PRN

## 2018-10-22 MED ORDER — OMEPRAZOLE 40 MG PO CPDR: 40.0000 mg | DELAYED_RELEASE_CAPSULE | Freq: Two times a day (BID) | ORAL | 1 refills | Status: DC

## 2018-10-22 MED ORDER — AMOXICILLIN 500 MG PO CAPS: 500.0000 mg | ORAL_CAPSULE | Freq: Two times a day (BID) | ORAL | 0 refills | Status: AC

## 2018-10-22 MED FILL — traMADol HCL 50 MG TABS: 50 | 20 days supply | Qty: 60 | Fill #0

## 2018-10-22 MED FILL — OMEPRAZOLE DR 40 MG CAPSULE: 40 | 15 days supply | Qty: 30 | Fill #0

## 2018-10-22 MED FILL — AMOXICILLIN 500 MG CAPSULE: 500 | 10 days supply | Qty: 20 | Fill #0
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
## 2018-10-22 NOTE — Progress Notes (Signed)
Per pt Ulcer is flaring up and when he drinks coffee or bear it comes up bad  Med refills for pain medication  Per pt he is having dental pain in his molar on the left side,   Patient turned down interpreter

## 2018-11-04 ENCOUNTER — Ambulatory Visit: Payer: Medicare Other | Attending: Family Medicine | Admitting: Pharmacist

## 2018-11-04 ENCOUNTER — Encounter: Payer: Self-pay | Admitting: Pharmacist

## 2018-11-04 ENCOUNTER — Other Ambulatory Visit: Payer: Self-pay

## 2018-11-04 DIAGNOSIS — F17210 Nicotine dependence, cigarettes, uncomplicated: Secondary | ICD-10-CM | POA: Insufficient documentation

## 2018-11-04 DIAGNOSIS — Z716 Tobacco abuse counseling: Secondary | ICD-10-CM | POA: Diagnosis not present

## 2018-11-04 NOTE — Patient Instructions (Signed)
  Continue using the patch and lozenge.   For the lozenge, you can use 1 lozenge every 2 hours. You can use up to 20 lozenges a day.

## 2018-11-04 NOTE — Progress Notes (Signed)
S:  Patient was reviewed by clinical pharmacist for assistance with tobacco cessation.   Tobacco Use History  Age when started using tobacco on a daily basis 58 YO.  Type: cigarettes.  Number of cigarettes per day 2-5   Estimated nicotine content: 0.8 - 1.2 mg/cig; ~6 mg/day   Smokes first cigarette upon awakening.  Does wake at night to smoke  Fagerstrom Score 8/10.  Triggers include physical: wakes to smoke, smokes after eating.  Quit Attempt History   Most recent quit attempt 2018.  Longest time ever been tobacco free: several weeks.  Methods tried in the past include Bupropion (Zyban), NRT via patch, Chantix   Rates IMPORTANCE of quitting tobacco on 1-10 scale of 10.  Rates READINESS of quitting tobacco on 1-10 scale of 10.  Rates CONFIDENCE of quitting tobacco on 1-10 scale of 5.  Motivators to quitting include cost, personal health; barriers include having the habit for so long   Current quit attempt:  Using Step 1 of nicotine patches.  Using the 4 mg lozenge; reports using 2 lozenges a day.   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   Reviewed options. Pt continues with Step 1. He is scared that Step 2 will not work. He is very pleased with the lozenges but forgot that he can use these more than twice a day.Treatment was reviewed with the patient, including name, instructions, goals of therapy, potential side effects, importance of adherence, and safe use.  Reviewed potential challenges and coping skills/strategies with patient. Provided information on 1 800-QUIT NOW support program and advised patient to contact me if questions/concerns arise. Patient verbalized understanding of information by repeating back.  Follow up in 2 months.  Benard Halsted, PharmD, [place] [phone]

## 2018-11-19 ENCOUNTER — Ambulatory Visit: Payer: Medicare Other | Attending: Family Medicine | Admitting: Family Medicine

## 2018-11-19 ENCOUNTER — Other Ambulatory Visit: Payer: Self-pay

## 2018-11-19 ENCOUNTER — Encounter: Payer: Self-pay | Admitting: Family Medicine

## 2018-11-19 DIAGNOSIS — K219 Gastro-esophageal reflux disease without esophagitis: Secondary | ICD-10-CM | POA: Insufficient documentation

## 2018-11-19 DIAGNOSIS — K0889 Other specified disorders of teeth and supporting structures: Secondary | ICD-10-CM

## 2018-11-19 DIAGNOSIS — M5412 Radiculopathy, cervical region: Secondary | ICD-10-CM

## 2018-11-19 MED ORDER — PREDNISONE 20 MG PO TABS: ORAL_TABLET | ORAL | 0 refills | Status: DC

## 2018-11-19 MED ORDER — ESOMEPRAZOLE MAGNESIUM 20 MG PO CPDR: 20.0000 mg | DELAYED_RELEASE_CAPSULE | Freq: Two times a day (BID) | ORAL | 3 refills | Status: DC

## 2018-11-19 NOTE — Progress Notes (Signed)
Patient has been called and DOB has been verified. Patient has been screened and transferred to PCP to start phone visit.    Patient is having pain in shoulder that radiates up to his neck.
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
## 2018-11-22 MED FILL — predniSONE 20 MG TABS: 20 | 6 days supply | Qty: 6 | Fill #0

## 2018-11-22 MED FILL — ESOMEPRAZOLE MAG DR 20 MG C: 20 | 30 days supply | Qty: 60 | Fill #0

## 2018-11-24 ENCOUNTER — Ambulatory Visit (INDEPENDENT_AMBULATORY_CARE_PROVIDER_SITE_OTHER): Payer: Medicare Other

## 2018-11-24 ENCOUNTER — Ambulatory Visit (INDEPENDENT_AMBULATORY_CARE_PROVIDER_SITE_OTHER): Payer: Medicare Other | Admitting: Orthopaedic Surgery

## 2018-11-24 ENCOUNTER — Other Ambulatory Visit: Payer: Self-pay

## 2018-11-24 DIAGNOSIS — M25512 Pain in left shoulder: Secondary | ICD-10-CM

## 2018-11-24 DIAGNOSIS — M5412 Radiculopathy, cervical region: Secondary | ICD-10-CM | POA: Diagnosis not present

## 2018-11-24 DIAGNOSIS — G8929 Other chronic pain: Secondary | ICD-10-CM | POA: Diagnosis not present

## 2018-11-24 MED ORDER — BUPIVACAINE HCL 0.25 % IJ SOLN
2.0000 mL | INTRAMUSCULAR | Status: AC | PRN
  Administered 2018-11-24: 2 mL via INTRA_ARTICULAR

## 2018-11-24 MED ORDER — LIDOCAINE HCL 2 % IJ SOLN
2.0000 mL | INTRAMUSCULAR | Status: AC | PRN
  Administered 2018-11-24: 2 mL

## 2018-11-24 MED ORDER — METHYLPREDNISOLONE ACETATE 40 MG/ML IJ SUSP
40.0000 mg | INTRAMUSCULAR | Status: AC | PRN
  Administered 2018-11-24: 40 mg via INTRA_ARTICULAR
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
## 2018-12-23 ENCOUNTER — Ambulatory Visit: Payer: Medicare Other | Admitting: Family Medicine

## 2019-01-05 ENCOUNTER — Ambulatory Visit: Payer: Medicare Other | Admitting: Pharmacist

## 2019-01-06 ENCOUNTER — Ambulatory Visit: Payer: Medicare Other | Admitting: Family Medicine

## 2019-01-21 ENCOUNTER — Ambulatory Visit (HOSPITAL_BASED_OUTPATIENT_CLINIC_OR_DEPARTMENT_OTHER): Payer: Medicare Other | Admitting: Pharmacist

## 2019-01-21 ENCOUNTER — Telehealth: Payer: Self-pay | Admitting: Licensed Clinical Social Worker

## 2019-01-21 ENCOUNTER — Ambulatory Visit: Payer: Medicare Other | Attending: Family Medicine | Admitting: Family Medicine

## 2019-01-21 ENCOUNTER — Other Ambulatory Visit: Payer: Self-pay

## 2019-01-21 VITALS — BP 149/81 | HR 65 | Temp 98.1°F | Resp 18 | Ht 64.0 in | Wt 124.0 lb

## 2019-01-21 DIAGNOSIS — K219 Gastro-esophageal reflux disease without esophagitis: Secondary | ICD-10-CM

## 2019-01-21 DIAGNOSIS — Z7952 Long term (current) use of systemic steroids: Secondary | ICD-10-CM | POA: Insufficient documentation

## 2019-01-21 DIAGNOSIS — F17210 Nicotine dependence, cigarettes, uncomplicated: Secondary | ICD-10-CM | POA: Insufficient documentation

## 2019-01-21 DIAGNOSIS — Z888 Allergy status to other drugs, medicaments and biological substances status: Secondary | ICD-10-CM | POA: Diagnosis not present

## 2019-01-21 DIAGNOSIS — M5412 Radiculopathy, cervical region: Secondary | ICD-10-CM | POA: Diagnosis not present

## 2019-01-21 DIAGNOSIS — Z23 Encounter for immunization: Secondary | ICD-10-CM | POA: Diagnosis not present

## 2019-01-21 DIAGNOSIS — Z590 Homelessness: Secondary | ICD-10-CM | POA: Insufficient documentation

## 2019-01-21 DIAGNOSIS — Z79899 Other long term (current) drug therapy: Secondary | ICD-10-CM | POA: Diagnosis not present

## 2019-01-21 DIAGNOSIS — F419 Anxiety disorder, unspecified: Secondary | ICD-10-CM | POA: Diagnosis not present

## 2019-01-21 DIAGNOSIS — I10 Essential (primary) hypertension: Secondary | ICD-10-CM | POA: Diagnosis present

## 2019-01-21 MED ORDER — AMLODIPINE BESYLATE 5 MG PO TABS: 5.0000 mg | ORAL_TABLET | Freq: Every day | ORAL | 3 refills | Status: DC

## 2019-01-21 MED FILL — AMLODIPINE BESYLATE 5 MG TA: 5 | 30 days supply | Qty: 30 | Fill #0

## 2019-01-21 NOTE — Progress Notes (Signed)
Patient presents for vaccination against influenza per orders of Dr. Fulp. Consent given. Counseling provided. No contraindications exists. Vaccine administered without incident.   

## 2019-01-21 NOTE — Telephone Encounter (Signed)
LCSW completed referral to Legal Aid to assist pt with pending eviction. No additional concerns noted.
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
## 2019-01-22 ENCOUNTER — Encounter: Payer: Self-pay | Admitting: Family Medicine

## 2019-02-01 ENCOUNTER — Telehealth: Payer: Self-pay | Admitting: Licensed Clinical Social Worker

## 2019-02-01 ENCOUNTER — Encounter: Payer: Self-pay | Admitting: Licensed Clinical Social Worker

## 2019-02-01 NOTE — Progress Notes (Signed)
LCSW followed up with [name] [name] regarding Legal Aid referral. Ms. [name] [name] confirmed that Legal Aid representative completed contact with patient two weeks ago. Pt was then referred to [employer] to assist with obtaining housing.   Legal aid will follow up with patient to address any remaining questions or concerns.

## 2019-02-01 NOTE — Telephone Encounter (Signed)
Call placed to patient to follow up on Legal Aid referral. Pt shared that he has not heard from Legal Aid. He is currently residing at storage unit with belongings. He has limited support in the community; however, reports ability to take shower at friend's home. Pt receives monthly income from disability.   LCSW will follow up with Legal Aid and provide update to patient.

## 2019-04-20 ENCOUNTER — Telehealth: Payer: Self-pay | Admitting: Licensed Clinical Social Worker

## 2019-04-20 NOTE — Telephone Encounter (Signed)
MSW Intern placed call to patient to provide housing resources. This MSW Intern provided patient with information about three apartments and an extended stay within patient's price range. Patient is in need of a SSDI verification letter.This MSW Intern provided patient with [facility]'s fax # and encouraged him to have the document faxed to the clinic.   Patient requested telehealth for his upcoming appointment with Dr. Chapman Fitch 04/22/19 due to transportation barriers.

## 2019-04-20 NOTE — Telephone Encounter (Signed)
Patient received Social Security Confirmation fax sent to [facility].

## 2019-04-22 ENCOUNTER — Other Ambulatory Visit: Payer: Self-pay

## 2019-04-22 ENCOUNTER — Encounter: Payer: Self-pay | Admitting: Family Medicine

## 2019-04-22 ENCOUNTER — Ambulatory Visit: Payer: Medicare Other | Attending: Family Medicine | Admitting: Family Medicine

## 2019-04-22 DIAGNOSIS — I10 Essential (primary) hypertension: Secondary | ICD-10-CM | POA: Diagnosis not present

## 2019-04-22 MED ORDER — AMLODIPINE BESYLATE 5 MG PO TABS: 5.0000 mg | ORAL_TABLET | Freq: Every day | ORAL | 3 refills | Status: DC

## 2019-04-22 MED FILL — AMLODIPINE BESYLATE 5 MG TA: 5 | 90 days supply | Qty: 90 | Fill #0
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
## 2019-05-11 ENCOUNTER — Telehealth: Payer: Self-pay | Admitting: Licensed Clinical Social Worker

## 2019-05-11 NOTE — Telephone Encounter (Signed)
Follow up call placed to patient. Patient shared that he was successful in obtaining a 2 Bedroom Apartment that he moved into this year. Pt is very appreciative for all of the assistance provided at the clinic.   LCSW commended patient for his resilience and strongly encouraged him to contact LCSW with any additional behavioral health or resource needs. Pt's address was updated in Epic.   No additional concerns noted

## 2019-08-18 ENCOUNTER — Other Ambulatory Visit: Payer: Self-pay

## 2019-08-18 ENCOUNTER — Ambulatory Visit: Payer: Medicare Other | Attending: Family Medicine | Admitting: Physician Assistant

## 2019-08-18 ENCOUNTER — Other Ambulatory Visit: Payer: Self-pay | Admitting: Physician Assistant

## 2019-08-18 VITALS — BP 127/85 | HR 67 | Temp 97.9°F | Ht 64.0 in | Wt 125.0 lb

## 2019-08-18 DIAGNOSIS — E119 Type 2 diabetes mellitus without complications: Secondary | ICD-10-CM | POA: Diagnosis not present

## 2019-08-18 DIAGNOSIS — Z79899 Other long term (current) drug therapy: Secondary | ICD-10-CM | POA: Diagnosis not present

## 2019-08-18 DIAGNOSIS — I10 Essential (primary) hypertension: Secondary | ICD-10-CM | POA: Diagnosis present

## 2019-08-18 DIAGNOSIS — Z7901 Long term (current) use of anticoagulants: Secondary | ICD-10-CM | POA: Insufficient documentation

## 2019-08-18 DIAGNOSIS — Z13220 Encounter for screening for lipoid disorders: Secondary | ICD-10-CM | POA: Insufficient documentation

## 2019-08-18 DIAGNOSIS — M79603 Pain in arm, unspecified: Secondary | ICD-10-CM | POA: Insufficient documentation

## 2019-08-18 DIAGNOSIS — M5412 Radiculopathy, cervical region: Secondary | ICD-10-CM | POA: Diagnosis not present

## 2019-08-18 DIAGNOSIS — Z131 Encounter for screening for diabetes mellitus: Secondary | ICD-10-CM | POA: Insufficient documentation

## 2019-08-18 DIAGNOSIS — M25519 Pain in unspecified shoulder: Secondary | ICD-10-CM | POA: Insufficient documentation

## 2019-08-18 MED ORDER — AMLODIPINE BESYLATE 5 MG PO TABS: 5.0000 mg | ORAL_TABLET | Freq: Every day | ORAL | 3 refills | Status: DC

## 2019-08-18 MED ORDER — GABAPENTIN 100 MG PO CAPS: ORAL_CAPSULE | ORAL | 11 refills | Status: DC

## 2019-08-18 MED FILL — AMLODIPINE BESYLATE 5 MG TA: 5 | 90 days supply | Qty: 90 | Fill #0

## 2019-08-18 MED FILL — GABAPENTIN 100 MG CAPSULE: 100 | 30 days supply | Qty: 60 | Fill #0

## 2019-08-18 NOTE — Progress Notes (Signed)
[name] [name], is a 59 y.o. male  DGU:[id number]  ZDG:[id number]  DOB - Mar 09, 1961  Subjective:  Chief Complaint and HPI: [name] [name] is a 59 y.o. male here today for BP check, labs, and to get gabapentin RF.  He has had neck injections for radiculopathy but still has some paresthesias and feels the gabapentin helped.    No CP/HA/Dizziness.  Taking BP meds.    ROS:   Constitutional:  No f/c, No night sweats, No unexplained weight loss. EENT:  No vision changes, No blurry vision, No hearing changes. No mouth, throat, or ear problems.  Respiratory: No cough, No SOB Cardiac: No CP, no palpitations GI:  No abd pain, No N/V/D. GU: No Urinary s/sx Musculoskeletal: see above Neuro: No headache, no dizziness, no motor weakness.  Skin: No rash Endocrine:  No polydipsia. No polyuria.  Psych: Denies SI/HI  No problems updated.  ALLERGIES: Allergies  Allergen Reactions  . Chantix [Varenicline Tartrate] Nausea And Vomiting    PAST MEDICAL HISTORY: Past Medical History:  Diagnosis Date  . Cataract   . Headache   . Hypertension     MEDICATIONS AT HOME: Prior to Admission medications   Medication Sig Start Date End Date Taking? Authorizing Provider  amLODipine (NORVASC) 5 MG tablet Take 1 tablet (5 mg total) by mouth daily. To lower blood pressure 08/18/19  Yes Lutricia Widjaja M, PA-C  acetaminophen (TYLENOL) 325 MG tablet Take 325 mg by mouth every 6 (six) hours as needed for mild pain.    [provider]  diphenhydrAMINE (BENADRYL) 25 mg capsule Take 1 capsule (25 mg total) by mouth every 6 (six) hours as needed for itching. Patient not taking: Reported on 08/18/2019 10/28/16   Lizbeth Bark, FNP  esomeprazole (NEXIUM) 20 MG capsule Take 1 capsule (20 mg total) by mouth 2 (two) times daily before a meal. Patient not taking: Reported on 08/18/2019 11/19/18   Cain Saupe, MD  gabapentin (NEURONTIN) 100 MG capsule One or two pills at night to help with arm pain 08/18/19    Anders Simmonds, PA-C  hydrocortisone 2.5 % cream Apply topically 2 (two) times daily. Patient not taking: Reported on 08/18/2019 10/28/16   Lizbeth Bark, FNP  ibuprofen (ADVIL,MOTRIN) 600 MG tablet Take 1 tablet (600 mg total) by mouth every 8 (eight) hours as needed for moderate pain (Take with food.). Patient not taking: Reported on 08/18/2019 10/09/16   Lizbeth Bark, FNP  nicotine (NICODERM CQ - DOSED IN MG/24 HOURS) 21 mg/24hr patch Place 1 patch (21 mg total) onto the skin daily. Patient not taking: Reported on 10/22/2018 08/19/18   Cain Saupe, MD  nicotine polacrilex (COMMIT) 4 MG lozenge Take 1 lozenge (4 mg total) by mouth as needed for smoking cessation. Patient not taking: Reported on 08/18/2019 08/19/18   Cain Saupe, MD  Nutritional Supplements (ENSURE ACTIVE) LIQD Take 1 Bottle by mouth daily as needed. Patient not taking: Reported on 10/22/2018 10/09/16   Lizbeth Bark, FNP  salicylic acid-lactic acid (COMPOUND W) 17 % external solution Apply topically daily. For 12 weeks. Patient not taking: Reported on 08/18/2019 07/04/16   Lizbeth Bark, FNP     Objective:  EXAM:   Vitals:   08/18/19 1338  BP: 127/85  Pulse: 67  Temp: 97.9 F (36.6 C)  SpO2: 100%  Weight: 125 lb (56.7 kg)  Height: 5\' 4"  (1.626 m)    General appearance : A&OX3. NAD. Non-toxic-appearing HEENT: Atraumatic and Normocephalic.  PERRLA. EOM intact.  Chest/Lungs:  Breathing-non-labored, Good air entry bilaterally, breath sounds normal without rales, rhonchi, or wheezing  CVS: S1 S2 regular, no murmurs, gallops, rubs  Extremities: Bilateral Lower Ext shows no edema, both legs are warm to touch with = pulse throughout Neurology:  CN II-XII grossly intact, Non focal.   Psych:  TP linear. J/I WNL. Normal speech. Appropriate eye contact and affect.  Skin:  No Rash  Data Review Lab Results  Component Value Date   HGBA1C 5.7 10/28/2016   HGBA1C 5.6 05/01/2014     Assessment & Plan     1. Essential hypertension Controlled-continue current regimen - amLODipine (NORVASC) 5 MG tablet; Take 1 tablet (5 mg total) by mouth daily. To lower blood pressure  Dispense: 90 tablet; Refill: 3 - Comprehensive metabolic panel - CBC with Differential/Platelet  2. Cervical radiculopathy - gabapentin (NEURONTIN) 100 MG capsule; One or two pills at night to help with arm pain  Dispense: 60 capsule; Refill: 11  3. Screening cholesterol level - Lipid panel  4. Screening for diabetes mellitus - Hemoglobin A1c   Patient have been counseled extensively about nutrition and exercise  Return in about 6 months (around 02/17/2020) for PCP.  The patient was given clear instructions to go to ER or return to medical center if symptoms don't improve, worsen or new problems develop. The patient verbalized understanding. The patient was told to call to get lab results if they haven't heard anything in the next week.     Freeman Caldron, PA-C [facility] [place], [place]   08/18/2019, 2:03 PMPatient ID: [name], male   DOB: [birth year], 59 y.o.   MRN: [id number]

## 2019-08-18 NOTE — Progress Notes (Signed)
He said the Gabapentin is not working for his shoulder pain, per pt he wants pain med if possible, shoulder pain is causing him problems with sleeping

## 2019-08-19 LAB — COMPREHENSIVE METABOLIC PANEL
ALT: 18 IU/L (ref 0–44)
AST: 20 IU/L (ref 0–40)
Albumin/Globulin Ratio: 2.2 (ref 1.2–2.2)
Albumin: 4.6 g/dL (ref 3.8–4.9)
Alkaline Phosphatase: 94 IU/L (ref 39–117)
BUN/Creatinine Ratio: 16 (ref 10–24)
BUN: 16 mg/dL (ref 8–27)
Bilirubin Total: 0.4 mg/dL (ref 0.0–1.2)
CO2: 22 mmol/L (ref 20–29)
Calcium: 9.3 mg/dL (ref 8.6–10.2)
Chloride: 102 mmol/L (ref 96–106)
Creatinine, Ser: 1 mg/dL (ref 0.76–1.27)
GFR calc Af Amer: 94 mL/min/{1.73_m2} (ref >59)
GFR calc non Af Amer: 81 mL/min/{1.73_m2} (ref >59)
Globulin, Total: 2.1 g/dL (ref 1.5–4.5)
Glucose: 78 mg/dL (ref 65–99)
Potassium: 4.2 mmol/L (ref 3.5–5.2)
Sodium: 140 mmol/L (ref 134–144)
Total Protein: 6.7 g/dL (ref 6.0–8.5)

## 2019-08-19 LAB — CBC WITH DIFFERENTIAL/PLATELET
Basophils Absolute: 0.1 10*3/uL (ref 0.0–0.2)
Basos: 1 %
EOS (ABSOLUTE): 0.4 10*3/uL (ref 0.0–0.4)
Eos: 4 %
Hematocrit: 45.4 % (ref 37.5–51.0)
Hemoglobin: 15.2 g/dL (ref 13.0–17.7)
Immature Grans (Abs): 0.1 10*3/uL (ref 0.0–0.1)
Immature Granulocytes: 1 %
Lymphocytes Absolute: 2.4 10*3/uL (ref 0.7–3.1)
Lymphs: 24 %
MCH: 26.9 pg (ref 26.6–33.0)
MCHC: 33.5 g/dL (ref 31.5–35.7)
MCV: 80 fL (ref 79–97)
Monocytes Absolute: 0.6 10*3/uL (ref 0.1–0.9)
Monocytes: 6 %
Neutrophils Absolute: 6.7 10*3/uL (ref 1.4–7.0)
Neutrophils: 64 %
Platelets: 221 10*3/uL (ref 150–450)
RBC: 5.66 x10E6/uL (ref 4.14–5.80)
RDW: 15.1 % (ref 11.6–15.4)
WBC: 10.3 10*3/uL (ref 3.4–10.8)

## 2019-08-19 LAB — LIPID PANEL
Chol/HDL Ratio: 2.8 ratio (ref 0.0–5.0)
Cholesterol, Total: 161 mg/dL (ref 100–199)
HDL: 57 mg/dL (ref >39)
LDL Chol Calc (NIH): 88 mg/dL (ref 0–99)
Triglycerides: 88 mg/dL (ref 0–149)
VLDL Cholesterol Cal: 16 mg/dL (ref 5–40)

## 2019-08-19 LAB — HEMOGLOBIN A1C
Est. average glucose Bld gHb Est-mCnc: 105 mg/dL
Hgb A1c MFr Bld: 5.3 % (ref 4.8–5.6)

## 2019-08-24 ENCOUNTER — Encounter: Payer: Self-pay | Admitting: *Deleted

## 2019-10-15 ENCOUNTER — Ambulatory Visit: Payer: Medicare Other | Attending: Internal Medicine

## 2019-10-15 DIAGNOSIS — Z23 Encounter for immunization: Secondary | ICD-10-CM

## 2019-10-15 NOTE — Progress Notes (Signed)
   Covid-19 Vaccination Clinic  Name:  [name] [name]    MRN: [id number] DOB: 06-29-1960  10/15/2019  [name] was observed post Covid-19 immunization for 15 minutes without incident. He was provided with Vaccine Information Sheet and instruction to access the V-Safe system.   [name] was instructed to call 911 with any severe reactions post vaccine: Marland Kitchen Difficulty breathing  . Swelling of face and throat  . A fast heartbeat  . A bad rash all over body  . Dizziness and weakness   Immunizations Administered    Name Date Dose VIS Date Route   Pfizer COVID-19 Vaccine 10/15/2019 10:45 AM 0.3 mL 06/29/2018 Intramuscular   Manufacturer: [employer], [employer]   Lot: [id number]   NDC: [address]-4

## 2019-11-12 ENCOUNTER — Ambulatory Visit: Payer: Medicare Other | Attending: Internal Medicine

## 2019-11-12 DIAGNOSIS — Z23 Encounter for immunization: Secondary | ICD-10-CM

## 2019-11-12 NOTE — Progress Notes (Signed)
   Covid-19 Vaccination Clinic  Name:  Markevion Lattin    MRN: [id number] DOB: 03-Jan-1961  11/12/2019  Mr. Broad was observed post Covid-19 immunization for 15 minutes without incident. He was provided with Vaccine Information Sheet and instruction to access the V-Safe system.   Mr. Laurel was instructed to call 911 with any severe reactions post vaccine: Marland Kitchen Difficulty breathing  . Swelling of face and throat  . A fast heartbeat  . A bad rash all over body  . Dizziness and weakness   Immunizations Administered    Name Date Dose VIS Date Route   Pfizer COVID-19 Vaccine 11/12/2019 10:39 AM 0.3 mL 06/29/2018 Intramuscular   Manufacturer: [employer], [employer]   Lot: [id number]   NDC: [address]-1

## 2020-03-19 ENCOUNTER — Encounter: Payer: Self-pay | Admitting: Family Medicine

## 2020-03-19 ENCOUNTER — Other Ambulatory Visit: Payer: Self-pay

## 2020-03-19 ENCOUNTER — Other Ambulatory Visit: Payer: Self-pay | Admitting: Family Medicine

## 2020-03-19 ENCOUNTER — Ambulatory Visit: Payer: Medicare Other | Attending: Family Medicine | Admitting: Family Medicine

## 2020-03-19 VITALS — BP 135/78 | HR 72 | Wt 124.0 lb

## 2020-03-19 DIAGNOSIS — K219 Gastro-esophageal reflux disease without esophagitis: Secondary | ICD-10-CM | POA: Diagnosis not present

## 2020-03-19 DIAGNOSIS — F17200 Nicotine dependence, unspecified, uncomplicated: Secondary | ICD-10-CM | POA: Diagnosis not present

## 2020-03-19 DIAGNOSIS — F17210 Nicotine dependence, cigarettes, uncomplicated: Secondary | ICD-10-CM | POA: Insufficient documentation

## 2020-03-19 DIAGNOSIS — M5412 Radiculopathy, cervical region: Secondary | ICD-10-CM | POA: Diagnosis not present

## 2020-03-19 DIAGNOSIS — I10 Essential (primary) hypertension: Secondary | ICD-10-CM | POA: Diagnosis not present

## 2020-03-19 DIAGNOSIS — Z79899 Other long term (current) drug therapy: Secondary | ICD-10-CM | POA: Diagnosis not present

## 2020-03-19 DIAGNOSIS — Z23 Encounter for immunization: Secondary | ICD-10-CM | POA: Diagnosis present

## 2020-03-19 MED ORDER — AMLODIPINE BESYLATE 5 MG PO TABS: 5.0000 mg | ORAL_TABLET | Freq: Every day | ORAL | 3 refills | Status: DC

## 2020-03-19 MED ORDER — OMEPRAZOLE 40 MG PO CPDR: 40.0000 mg | DELAYED_RELEASE_CAPSULE | Freq: Two times a day (BID) | ORAL | 4 refills | Status: DC

## 2020-03-19 MED FILL — AMLODIPINE BESYLATE 5 MG TA: 5 | 90 days supply | Qty: 90 | Fill #1

## 2020-03-19 MED FILL — OMEPRAZOLE DR 40 MG CAPSULE: 40 | 90 days supply | Qty: 180 | Fill #0

## 2020-03-19 MED FILL — GABAPENTIN 100 MG CAPSULE: 100 | 30 days supply | Qty: 60 | Fill #1

## 2020-03-19 NOTE — Progress Notes (Signed)
BP CHECK STOMACH ULCER  BURNING IN STOMACH AND THROAT  LEFT ARM/SHOULDER PAIN
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
## 2020-03-20 ENCOUNTER — Telehealth: Payer: Self-pay

## 2020-03-20 NOTE — Telephone Encounter (Signed)
Call placed to patient to discuss need for someone to accompany him to a colonoscopy appointment. He said that he has no friends/family in the area that would be available to take him to the appointment and he does not have support from a local church/regligious organization. .  Explained to him that it is not an all day appointment but he said everyone works and no one would be available to help.    He said that Alden Server, who was noted as a contact for him can be removed as a contact. He explained that he has not heard from [place] in months and he left and the patient does not know where he is.   Informed him that this CM would check with the congregational nurses for possible resources to take him to the colonoscopy and encouraged him to check again with his acquaintances.

## 2020-03-21 ENCOUNTER — Telehealth: Payer: Self-pay

## 2020-03-21 LAB — H PYLORI, IGM, IGG, IGA AB
H pylori, IgM Abs: <9 U (ref 0.0–8.9)
H. pylori, IgA Abs: <9 U (ref 0.0–8.9)
H. pylori, IgG AbS: 0.71 {index_val} (ref 0.00–0.79)

## 2020-03-21 NOTE — Telephone Encounter (Signed)
This CM spoke to Turkey Hussey,RN/Congregational Nurse regarding patient's need to have someone accompany him to his colonoscopy.  She said she will check with her co-workers for possible resources in the community

## 2020-06-04 ENCOUNTER — Other Ambulatory Visit: Payer: Self-pay | Admitting: Pharmacist

## 2020-06-04 DIAGNOSIS — I10 Essential (primary) hypertension: Secondary | ICD-10-CM

## 2020-06-20 ENCOUNTER — Encounter: Payer: Self-pay | Admitting: Family Medicine

## 2020-06-20 ENCOUNTER — Other Ambulatory Visit: Payer: Self-pay

## 2020-06-20 ENCOUNTER — Other Ambulatory Visit: Payer: Self-pay | Admitting: Family Medicine

## 2020-06-20 ENCOUNTER — Ambulatory Visit: Payer: Medicare Other | Admitting: Family Medicine

## 2020-06-20 ENCOUNTER — Ambulatory Visit: Payer: Medicare HMO | Attending: Family Medicine | Admitting: Family Medicine

## 2020-06-20 VITALS — BP 126/71 | HR 66 | Ht 64.0 in | Wt 123.0 lb

## 2020-06-20 DIAGNOSIS — M5412 Radiculopathy, cervical region: Secondary | ICD-10-CM | POA: Diagnosis not present

## 2020-06-20 DIAGNOSIS — I10 Essential (primary) hypertension: Secondary | ICD-10-CM | POA: Diagnosis not present

## 2020-06-20 DIAGNOSIS — K219 Gastro-esophageal reflux disease without esophagitis: Secondary | ICD-10-CM | POA: Diagnosis not present

## 2020-06-20 DIAGNOSIS — R6889 Other general symptoms and signs: Secondary | ICD-10-CM | POA: Diagnosis not present

## 2020-06-20 MED ORDER — TIZANIDINE HCL 4 MG PO TABS: 4.0000 mg | ORAL_TABLET | Freq: Every evening | ORAL | 3 refills | Status: DC | PRN

## 2020-06-20 MED ORDER — AMLODIPINE BESYLATE 5 MG PO TABS: 5.0000 mg | ORAL_TABLET | Freq: Every day | ORAL | 1 refills | Status: DC

## 2020-06-20 MED FILL — AMLODIPINE BESYLATE 5 MG TA: 5 | 90 days supply | Qty: 90 | Fill #0

## 2020-06-20 MED FILL — tiZANidine HCL 4 MG TABS: 4 | 30 days supply | Qty: 30 | Fill #0

## 2020-06-20 NOTE — Patient Instructions (Signed)
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
## 2020-06-20 NOTE — Progress Notes (Signed)
Having pain in neck and shoulders. 

## 2020-06-21 ENCOUNTER — Telehealth: Payer: Self-pay

## 2020-06-21 LAB — BASIC METABOLIC PANEL
BUN/Creatinine Ratio: 13 (ref 10–24)
BUN: 16 mg/dL (ref 8–27)
CO2: 21 mmol/L (ref 20–29)
Calcium: 9.4 mg/dL (ref 8.6–10.2)
Chloride: 101 mmol/L (ref 96–106)
Creatinine, Ser: 1.21 mg/dL (ref 0.76–1.27)
GFR calc Af Amer: 74 mL/min/{1.73_m2} (ref >59)
GFR calc non Af Amer: 64 mL/min/{1.73_m2} (ref >59)
Glucose: 84 mg/dL (ref 65–99)
Potassium: 4.2 mmol/L (ref 3.5–5.2)
Sodium: 139 mmol/L (ref 134–144)

## 2020-06-21 NOTE — Telephone Encounter (Signed)
Patient name and DOB has been verified Patient was informed of lab results. Patient had no questions.  

## 2020-06-21 NOTE — Telephone Encounter (Signed)
-----   Message from Enobong Newlin, MD sent at 06/21/2020 11:18 AM EST ----- Please inform the patient that labs are normal. Thank you. 

## 2021-01-01 ENCOUNTER — Other Ambulatory Visit: Payer: Self-pay

## 2021-01-01 MED FILL — Amlodipine Besylate Tab 5 MG (Base Equivalent): ORAL | 90 days supply | Qty: 90 | Fill #0 | Status: AC

## 2021-01-01 MED FILL — Tizanidine HCl Tab 4 MG (Base Equivalent): ORAL | 30 days supply | Qty: 30 | Fill #0 | Status: AC

## 2021-01-02 ENCOUNTER — Other Ambulatory Visit: Payer: Self-pay

## 2021-01-02 ENCOUNTER — Ambulatory Visit: Payer: Medicare HMO | Attending: Physician Assistant | Admitting: Physician Assistant

## 2021-01-02 VITALS — BP 115/70 | HR 69 | Ht 64.0 in | Wt 122.2 lb

## 2021-01-02 DIAGNOSIS — Z13220 Encounter for screening for lipoid disorders: Secondary | ICD-10-CM

## 2021-01-02 DIAGNOSIS — I10 Essential (primary) hypertension: Secondary | ICD-10-CM

## 2021-01-02 MED ORDER — AMLODIPINE BESYLATE 5 MG PO TABS
ORAL_TABLET | Freq: Two times a day (BID) | ORAL | 1 refills | Status: DC
  Filled 2021-01-02: qty 90, fill #0
  Filled 2021-03-27: qty 90, 90d supply, fill #0

## 2021-01-02 NOTE — Progress Notes (Signed)
Pain from shoulder to the hand

## 2021-01-02 NOTE — Progress Notes (Signed)
Patient ID: [name] [name], male   DOB: 1961-03-25, 60 y.o.   MRN: [id number]   [name] [name], is a 60 y.o. male  [id number]  [id number]  DOB - 1960/06/12  Chief Complaint  Patient presents with   Hypertension   Arm Pain       Subjective:   [name] [name] is a 60 y.o. male here today for BP med RF and labs.  He denies any issues or concerns.  Still having ongoing concerns with cervical radiculopathy but does not want to have surgery.  Uses zanaflex for pain at night.    No problems updated.  ALLERGIES: Allergies  Allergen Reactions   Chantix [Varenicline Tartrate] Nausea And Vomiting    PAST MEDICAL HISTORY: Past Medical History:  Diagnosis Date   Cataract    Headache    Hypertension     MEDICATIONS AT HOME: Prior to Admission medications   Medication Sig Start Date End Date Taking? Authorizing Provider  acetaminophen (TYLENOL) 325 MG tablet Take 325 mg by mouth every 6 (six) hours as needed for mild pain.   Yes [provider]  diphenhydrAMINE (BENADRYL) 25 mg capsule Take 1 capsule (25 mg total) by mouth every 6 (six) hours as needed for itching. 10/28/16  Yes Hairston, Oren Beckmann, FNP  ibuprofen (ADVIL,MOTRIN) 600 MG tablet Take 1 tablet (600 mg total) by mouth every 8 (eight) hours as needed for moderate pain (Take with food.). 10/09/16  Yes Hairston, Mandesia R, FNP  omeprazole (PRILOSEC) 40 MG capsule TAKE 1 CAPSULE (40 MG TOTAL) BY MOUTH 2 (TWO) TIMES DAILY. TO REDUCE STOMACH ACID 03/19/20 03/19/21 Yes Fulp, Cammie, MD  salicylic acid-lactic acid (COMPOUND W) 17 % external solution Apply topically daily. For 12 weeks. 07/04/16  Yes Hairston, Mandesia R, FNP  tiZANidine (ZANAFLEX) 4 MG tablet TAKE 1 TABLET (4 MG TOTAL) BY MOUTH AT BEDTIME AS NEEDED FOR MUSCLE SPASMS. 06/20/20 06/20/21 Yes Newlin, Enobong, MD  amLODipine (NORVASC) 5 MG tablet TAKE 1 TABLET (5 MG TOTAL) BY MOUTH DAILY. TO LOWER BLOOD PRESSURE 01/02/21 01/02/22  Anders Simmonds, PA-C  hydrocortisone 2.5 % cream  Apply topically 2 (two) times daily. Patient not taking: No sig reported 10/28/16   Lizbeth Bark, FNP  nicotine (NICODERM CQ - DOSED IN MG/24 HOURS) 21 mg/24hr patch Place 1 patch (21 mg total) onto the skin daily. Patient not taking: No sig reported 08/19/18   Fulp, Cammie, MD  nicotine polacrilex (COMMIT) 4 MG lozenge Take 1 lozenge (4 mg total) by mouth as needed for smoking cessation. Patient not taking: No sig reported 08/19/18   Cain Saupe, MD  Nutritional Supplements (ENSURE ACTIVE) LIQD Take 1 Bottle by mouth daily as needed. Patient not taking: No sig reported 10/09/16   Arrie Senate R, FNP    ROS: Neg HEENT Neg resp Neg cardiac Neg GI Neg GU Neg psych Neg neuro  Objective:   Vitals:   01/02/21 1341  BP: 115/70  Pulse: 69  SpO2: 100%  Weight: 122 lb 4 oz (55.5 kg)  Height: 5\' 4"  (1.626 m)   Exam General appearance : Awake, alert, not in any distress. Speech Clear. Not toxic looking HEENT: Atraumatic and Normocephalic Neck: Supple, no JVD. No cervical lymphadenopathy.  Chest: Good air entry bilaterally, CTAB.  No rales/rhonchi/wheezing CVS: S1 S2 regular, no murmurs.  Extremities: B/L Lower Ext shows no edema, both legs are warm to touch Neurology: Awake alert, and oriented X 3, CN II-XII intact, Non focal Skin: No Rash  Data Review  Lab Results  Component Value Date   HGBA1C 5.3 08/18/2019   HGBA1C 5.7 10/28/2016   HGBA1C 5.6 05/01/2014    Assessment & Plan   1. Essential hypertension Controlled-continue - amLODipine (NORVASC) 5 MG tablet; TAKE 1 TABLET (5 MG TOTAL) BY MOUTH DAILY. TO LOWER BLOOD PRESSURE  Dispense: 90 tablet; Refill: 1 - Comprehensive metabolic panel - CBC with Differential/Platelet  2. Screening cholesterol level - Lipid panel    Patient have been counseled extensively about nutrition and exercise. Other issues discussed during this visit include: low cholesterol diet, weight control and daily exercise, foot care,  annual eye examinations at Ophthalmology, importance of adherence with medications and regular follow-up. We also discussed long term complications of uncontrolled diabetes and hypertension.   Return in about 6 months (around 07/02/2021) for assign new PCP.  The patient was given clear instructions to go to ER or return to medical center if symptoms don't improve, worsen or new problems develop. The patient verbalized understanding. The patient was told to call to get lab results if they haven't heard anything in the next week.      Georgian Co, PA-C [facility] and Wellness [place], [place] [phone]   01/02/2021, 1:54 PM

## 2021-01-03 LAB — LIPID PANEL
Chol/HDL Ratio: 3.2 ratio (ref 0.0–5.0)
Cholesterol, Total: 167 mg/dL (ref 100–199)
HDL: 53 mg/dL (ref >39)
LDL Chol Calc (NIH): 88 mg/dL (ref 0–99)
Triglycerides: 151 mg/dL — ABNORMAL HIGH (ref 0–149)
VLDL Cholesterol Cal: 26 mg/dL (ref 5–40)

## 2021-01-03 LAB — CBC WITH DIFFERENTIAL/PLATELET
Basophils Absolute: 0.1 10*3/uL (ref 0.0–0.2)
Basos: 1 %
EOS (ABSOLUTE): 0.4 10*3/uL (ref 0.0–0.4)
Eos: 4 %
Hematocrit: 44.5 % (ref 37.5–51.0)
Hemoglobin: 14.9 g/dL (ref 13.0–17.7)
Immature Grans (Abs): 0.1 10*3/uL (ref 0.0–0.1)
Immature Granulocytes: 1 %
Lymphocytes Absolute: 2.2 10*3/uL (ref 0.7–3.1)
Lymphs: 22 %
MCH: 27.1 pg (ref 26.6–33.0)
MCHC: 33.5 g/dL (ref 31.5–35.7)
MCV: 81 fL (ref 79–97)
Monocytes Absolute: 0.6 10*3/uL (ref 0.1–0.9)
Monocytes: 6 %
Neutrophils Absolute: 6.6 10*3/uL (ref 1.4–7.0)
Neutrophils: 66 %
Platelets: 194 10*3/uL (ref 150–450)
RBC: 5.5 x10E6/uL (ref 4.14–5.80)
RDW: 14.6 % (ref 11.6–15.4)
WBC: 9.9 10*3/uL (ref 3.4–10.8)

## 2021-01-03 LAB — COMPREHENSIVE METABOLIC PANEL
ALT: 16 IU/L (ref 0–44)
AST: 17 IU/L (ref 0–40)
Albumin/Globulin Ratio: 2.2 (ref 1.2–2.2)
Albumin: 4.7 g/dL (ref 3.8–4.8)
Alkaline Phosphatase: 88 IU/L (ref 44–121)
BUN/Creatinine Ratio: 13 (ref 10–24)
BUN: 14 mg/dL (ref 8–27)
Bilirubin Total: 0.2 mg/dL (ref 0.0–1.2)
CO2: 27 mmol/L (ref 20–29)
Calcium: 9.7 mg/dL (ref 8.6–10.2)
Chloride: 103 mmol/L (ref 96–106)
Creatinine, Ser: 1.12 mg/dL (ref 0.76–1.27)
Globulin, Total: 2.1 g/dL (ref 1.5–4.5)
Glucose: 102 mg/dL — ABNORMAL HIGH (ref 65–99)
Potassium: 4.5 mmol/L (ref 3.5–5.2)
Sodium: 142 mmol/L (ref 134–144)
Total Protein: 6.8 g/dL (ref 6.0–8.5)
eGFR: 75 mL/min/{1.73_m2} (ref >59)

## 2021-03-14 ENCOUNTER — Telehealth (INDEPENDENT_AMBULATORY_CARE_PROVIDER_SITE_OTHER): Payer: Self-pay

## 2021-03-14 DIAGNOSIS — K056 Periodontal disease, unspecified: Secondary | ICD-10-CM

## 2021-03-14 NOTE — Telephone Encounter (Signed)
Copied from CRM [id number]. Topic: Referral - Request for Referral >> Mar 14, 2021 11:14 AM Traci Sermon wrote: Has patient seen PCP for this complaint? Yes.    Referral for which specialty: Dentistry   Preferred provider/office: Any  Reason for referral: Teeth issue, pain  Patient saw Marylene Land on 01-02-21 has an appt with Dr.Wright in Feb 28-23.  Please advice if a referral can be placed. If so please send referral and route to [facility] Medic[facility].  Thank you

## 2021-03-15 NOTE — Telephone Encounter (Signed)
Dental referral sent 

## 2021-03-15 NOTE — Addendum Note (Signed)
Addended by: Storm Frisk on: 03/15/2021 08:15 AM   Modules accepted: Orders

## 2021-03-16 ENCOUNTER — Encounter (HOSPITAL_COMMUNITY): Payer: Self-pay | Admitting: Emergency Medicine

## 2021-03-16 ENCOUNTER — Other Ambulatory Visit: Payer: Self-pay

## 2021-03-16 ENCOUNTER — Emergency Department (HOSPITAL_COMMUNITY)
Admission: EM | Admit: 2021-03-16 | Discharge: 2021-03-16 | Disposition: A | Discharge status: Home / Self Care | Payer: Medicare HMO | Attending: Emergency Medicine | Admitting: Emergency Medicine

## 2021-03-16 DIAGNOSIS — F17210 Nicotine dependence, cigarettes, uncomplicated: Secondary | ICD-10-CM | POA: Insufficient documentation

## 2021-03-16 DIAGNOSIS — I10 Essential (primary) hypertension: Secondary | ICD-10-CM | POA: Diagnosis not present

## 2021-03-16 DIAGNOSIS — Z79899 Other long term (current) drug therapy: Secondary | ICD-10-CM | POA: Diagnosis not present

## 2021-03-16 DIAGNOSIS — K0889 Other specified disorders of teeth and supporting structures: Secondary | ICD-10-CM | POA: Diagnosis not present

## 2021-03-16 MED ORDER — HYDROCODONE-ACETAMINOPHEN 5-325 MG PO TABS
1.0000 | ORAL_TABLET | Freq: Once | ORAL | Status: AC
  Administered 2021-03-16: 1 via ORAL
  Filled 2021-03-16: qty 1

## 2021-03-16 MED ORDER — IBUPROFEN 800 MG PO TABS
800.0000 mg | ORAL_TABLET | Freq: Once | ORAL | Status: AC
  Administered 2021-03-16: 800 mg via ORAL
  Filled 2021-03-16: qty 1
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
## 2021-03-16 NOTE — ED Notes (Signed)
Discharged by PA at triage. 

## 2021-03-16 NOTE — ED Provider Notes (Signed)
Emergency Medicine Provider Triage Evaluation Note  [name] [name] , a 60 y.o. male  was evaluated in triage.  Pt complains of left-sided upper dental pain, as well as lower dental pain for the last 4 to 5 days.  Patient does have known cavities, needs to see dentist.  Patient has been taking Advil for pain with minimal relief.  Patient reports significant pain when he climbs jaw together.  Patient denies any taste in the mouth, purulent drainage, fever, chills.  Review of Systems  Positive: As above Negative: As above  Physical Exam  BP (!) 165/93 (BP Location: Right Arm)   Pulse 69   Temp 97.6 F (36.4 C) (Oral)   Resp 18   SpO2 99%  Gen:   Awake, no distress   Resp:  Normal effort  MSK:   Moves extremities without difficulty  Other:  Poor dentition w/o evidence of dental abscess, peritonsillar abscess, uvular deviation  Medical Decision Making  Medically screening exam initiated at 12:52 PM.  Appropriate orders placed.  [name] [name] was informed that the remainder of the evaluation will be completed by another provider, this initial triage assessment does not replace that evaluation, and the importance of remaining in the ED until their evaluation is complete.  Dental pain   [place] 03/16/21 1254    Gloris Manchester, MD 03/17/21 [phone]

## 2021-03-16 NOTE — ED Triage Notes (Signed)
L upper dental pain since Thursday.

## 2021-03-16 NOTE — Discharge Instructions (Signed)
Please follow up with a dentist at your earliest convenience. If you develop fever or chills please return for further evaluation.

## 2021-03-27 ENCOUNTER — Other Ambulatory Visit: Payer: Self-pay

## 2021-03-27 ENCOUNTER — Other Ambulatory Visit: Payer: Self-pay | Admitting: Family Medicine

## 2021-03-27 DIAGNOSIS — M5412 Radiculopathy, cervical region: Secondary | ICD-10-CM

## 2021-03-27 DIAGNOSIS — I10 Essential (primary) hypertension: Secondary | ICD-10-CM

## 2021-03-27 MED ORDER — AMLODIPINE BESYLATE 5 MG PO TABS: ORAL_TABLET | Freq: Two times a day (BID) | ORAL | 0 refills | Status: DC

## 2021-03-27 MED FILL — Tizanidine HCl Tab 4 MG (Base Equivalent): ORAL | 30 days supply | Qty: 30 | Fill #1 | Status: AC

## 2021-03-27 NOTE — Telephone Encounter (Signed)
Copied from CRM [id number]. Topic: Quick Communication - Rx Refill/Question >> Mar 27, 2021  1:34 PM Jaquita Rector A wrote: Medication: amLODipine (NORVASC) 5 MG tablet, tiZANidine (ZANAFLEX) 4 MG tablet  No payment required with [facility]  Has the patient contacted their pharmacy? Yes.  Pharmacy called  (Agent: If no, request that the patient contact the pharmacy for the refill. If patient does not wish to contact the pharmacy document the reason why and proceed with request.) (Agent: If yes, when and what did the pharmacy advise?)  Preferred Pharmacy (with phone number or street name): [facility] - [place], [place] - [address]  Phone:  [phone] Fax:  [phone]    Has the patient been seen for an appointment in the last year OR does the patient have an upcoming appointment? Yes.    Agent: Please be advised that RX refills may take up to 3 business days. We ask that you follow-up with your pharmacy.

## 2021-03-27 NOTE — Telephone Encounter (Signed)
Requested medication (s) are due for refill today: Yes  Requested medication (s) are on the active medication list: Yes  Last refill:  06/20/20 #30/3 RF  Future visit scheduled: Yes  Notes to clinic:  Unable to refill per protocol, cannot delegate.      Requested Prescriptions  Pending Prescriptions Disp Refills   tiZANidine (ZANAFLEX) 4 MG tablet 30 tablet 3    Sig: TAKE 1 TABLET (4 MG TOTAL) BY MOUTH AT BEDTIME AS NEEDED FOR MUSCLE SPASMS.     Not Delegated - Cardiovascular:  Alpha-2 Agonists - tizanidine Failed - 03/27/2021  1:54 PM      Failed - This refill cannot be delegated      Passed - Valid encounter within last 6 months    Recent Outpatient Visits           2 months ago Screening cholesterol level   [facility] [place], [place], [place]   9 months ago Gastroesophageal reflux disease without esophagitis   [facility] Hoy Register, MD   1 year ago Essential hypertension   [facility] [place], [place], [place]   1 year ago Screening cholesterol level   [facility] [place], Marzella [place], [place]   1 year ago Essential hypertension   [facility] Cain Saupe, MD       Future Appointments             In 3 months Storm Frisk, MD [facility]            Signed Prescriptions Disp Refills   amLODipine (NORVASC) 5 MG tablet 90 tablet 0    Sig: TAKE 1 TABLET (5 MG TOTAL) BY MOUTH DAILY. TO LOWER BLOOD PRESSURE     Cardiovascular:  Calcium Channel Blockers Failed - 03/27/2021  1:54 PM      Failed - Last BP in normal range    BP Readings from Last 1 Encounters:  03/16/21 (!) 165/93          Passed - Valid encounter within last 6 months    Recent Outpatient Visits           2 months ago Screening cholesterol level   [facility] [place], [place], [place]   9  months ago Gastroesophageal reflux disease without esophagitis   [facility] [place], Odette Horns, [place]   1 year ago Essential hypertension   [facility] [place], [place], [place]   1 year ago Screening cholesterol level   [facility] [place], [place], [place]   1 year ago Essential hypertension   [facility] Cain Saupe, MD       Future Appointments             In 3 months Delford Field, Charlcie Cradle, MD [facility]

## 2021-03-27 NOTE — Telephone Encounter (Signed)
Requested Prescriptions  Pending Prescriptions Disp Refills  . amLODipine (NORVASC) 5 MG tablet 90 tablet 0    Sig: TAKE 1 TABLET (5 MG TOTAL) BY MOUTH DAILY. TO LOWER BLOOD PRESSURE     Cardiovascular:  Calcium Channel Blockers Failed - 03/27/2021  1:54 PM      Failed - Last BP in normal range    BP Readings from Last 1 Encounters:  03/16/21 (!) 165/93         Passed - Valid encounter within last 6 months    Recent Outpatient Visits          2 months ago Screening cholesterol level   [facility] [place], [place], [place]   9 months ago Gastroesophageal reflux disease without esophagitis   [facility] [place], Odette Horns, [place]   1 year ago Essential hypertension   [facility] [place], [place], [place]   1 year ago Screening cholesterol level   [facility] [place], [place], [place]   1 year ago Essential hypertension   [facility] Cain Saupe, MD      Future Appointments            In 3 months Delford Field Charlcie Cradle, MD [facility]           . tiZANidine (ZANAFLEX) 4 MG tablet 30 tablet 3    Sig: TAKE 1 TABLET (4 MG TOTAL) BY MOUTH AT BEDTIME AS NEEDED FOR MUSCLE SPASMS.     Not Delegated - Cardiovascular:  Alpha-2 Agonists - tizanidine Failed - 03/27/2021  1:54 PM      Failed - This refill cannot be delegated      Passed - Valid encounter within last 6 months    Recent Outpatient Visits          2 months ago Screening cholesterol level   [facility] [place], [place], [place]   9 months ago Gastroesophageal reflux disease without esophagitis   [facility] [place], Odette Horns, [place]   1 year ago Essential hypertension   [facility] [place], [place], [place]   1 year ago Screening cholesterol level   [facility]  [place], [place], [place]   1 year ago Essential hypertension   [facility] Cain Saupe, MD      Future Appointments            In 3 months Delford Field Charlcie Cradle, MD [facility]

## 2021-04-01 MED ORDER — TIZANIDINE HCL 4 MG PO TABS: ORAL_TABLET | ORAL | 3 refills | Status: DC

## 2021-07-02 ENCOUNTER — Emergency Department (HOSPITAL_COMMUNITY): Payer: Medicare HMO

## 2021-07-02 ENCOUNTER — Encounter (HOSPITAL_COMMUNITY): Payer: Self-pay

## 2021-07-02 ENCOUNTER — Emergency Department (HOSPITAL_COMMUNITY)
Admission: EM | Admit: 2021-07-02 | Discharge: 2021-07-02 | Disposition: A | Discharge status: Home / Self Care | Payer: Medicare HMO | Attending: Emergency Medicine | Admitting: Emergency Medicine

## 2021-07-02 ENCOUNTER — Ambulatory Visit: Payer: Medicare HMO | Attending: Critical Care Medicine | Admitting: Critical Care Medicine

## 2021-07-02 ENCOUNTER — Other Ambulatory Visit: Payer: Self-pay

## 2021-07-02 ENCOUNTER — Encounter: Payer: Self-pay | Admitting: Critical Care Medicine

## 2021-07-02 DIAGNOSIS — M50223 Other cervical disc displacement at C6-C7 level: Secondary | ICD-10-CM | POA: Diagnosis not present

## 2021-07-02 DIAGNOSIS — M6281 Muscle weakness (generalized): Secondary | ICD-10-CM | POA: Diagnosis not present

## 2021-07-02 DIAGNOSIS — R791 Abnormal coagulation profile: Secondary | ICD-10-CM | POA: Diagnosis not present

## 2021-07-02 DIAGNOSIS — R519 Headache, unspecified: Secondary | ICD-10-CM | POA: Diagnosis not present

## 2021-07-02 DIAGNOSIS — R531 Weakness: Secondary | ICD-10-CM | POA: Diagnosis not present

## 2021-07-02 DIAGNOSIS — Z20822 Contact with and (suspected) exposure to covid-19: Secondary | ICD-10-CM | POA: Insufficient documentation

## 2021-07-02 DIAGNOSIS — M4802 Spinal stenosis, cervical region: Secondary | ICD-10-CM | POA: Diagnosis not present

## 2021-07-02 DIAGNOSIS — Z79899 Other long term (current) drug therapy: Secondary | ICD-10-CM | POA: Insufficient documentation

## 2021-07-02 DIAGNOSIS — G459 Transient cerebral ischemic attack, unspecified: Secondary | ICD-10-CM | POA: Diagnosis not present

## 2021-07-02 DIAGNOSIS — R6889 Other general symptoms and signs: Secondary | ICD-10-CM | POA: Diagnosis not present

## 2021-07-02 LAB — COMPREHENSIVE METABOLIC PANEL
ALT: 16 U/L (ref 0–44)
AST: 25 U/L (ref 15–41)
Albumin: 4.2 g/dL (ref 3.5–5.0)
Alkaline Phosphatase: 74 U/L (ref 38–126)
Anion gap: 9 (ref 5–15)
BUN: 17 mg/dL (ref 8–23)
CO2: 25 mmol/L (ref 22–32)
Calcium: 9.3 mg/dL (ref 8.9–10.3)
Chloride: 102 mmol/L (ref 98–111)
Creatinine, Ser: 1.17 mg/dL (ref 0.61–1.24)
GFR, Estimated: >60 mL/min (ref >60)
Glucose, Bld: 86 mg/dL (ref 70–99)
Potassium: 4.5 mmol/L (ref 3.5–5.1)
Sodium: 136 mmol/L (ref 135–145)
Total Bilirubin: 0.9 mg/dL (ref 0.3–1.2)
Total Protein: 6.8 g/dL (ref 6.5–8.1)

## 2021-07-02 LAB — CBC
HCT: 48.4 % (ref 39.0–52.0)
Hemoglobin: 16 g/dL (ref 13.0–17.0)
MCH: 27.2 pg (ref 26.0–34.0)
MCHC: 33.1 g/dL (ref 30.0–36.0)
MCV: 82.2 fL (ref 80.0–100.0)
Platelets: 215 10*3/uL (ref 150–400)
RBC: 5.89 MIL/uL — ABNORMAL HIGH (ref 4.22–5.81)
RDW: 14.3 % (ref 11.5–15.5)
WBC: 11.8 10*3/uL — ABNORMAL HIGH (ref 4.0–10.5)
nRBC: 0 % (ref 0.0–0.2)

## 2021-07-02 LAB — URINALYSIS, ROUTINE W REFLEX MICROSCOPIC
Bilirubin Urine: NEGATIVE
Glucose, UA: NEGATIVE mg/dL
Hgb urine dipstick: NEGATIVE
Ketones, ur: NEGATIVE mg/dL
Leukocytes,Ua: NEGATIVE
Nitrite: NEGATIVE
Protein, ur: NEGATIVE mg/dL
Specific Gravity, Urine: 1.008 (ref 1.005–1.030)
pH: 5 (ref 5.0–8.0)

## 2021-07-02 LAB — DIFFERENTIAL
Abs Immature Granulocytes: 0.08 10*3/uL — ABNORMAL HIGH (ref 0.00–0.07)
Basophils Absolute: 0.1 10*3/uL (ref 0.0–0.1)
Basophils Relative: 1 %
Eosinophils Absolute: 0.7 10*3/uL — ABNORMAL HIGH (ref 0.0–0.5)
Eosinophils Relative: 6 %
Immature Granulocytes: 1 %
Lymphocytes Relative: 18 %
Lymphs Abs: 2.2 10*3/uL (ref 0.7–4.0)
Monocytes Absolute: 0.7 10*3/uL (ref 0.1–1.0)
Monocytes Relative: 6 %
Neutro Abs: 8.1 10*3/uL — ABNORMAL HIGH (ref 1.7–7.7)
Neutrophils Relative %: 68 %

## 2021-07-02 LAB — ETHANOL: Alcohol, Ethyl (B): <10 mg/dL (ref <10)

## 2021-07-02 LAB — I-STAT CHEM 8, ED
BUN: 26 mg/dL — ABNORMAL HIGH (ref 8–23)
Calcium, Ion: 1.12 mmol/L — ABNORMAL LOW (ref 1.15–1.40)
Chloride: 105 mmol/L (ref 98–111)
Creatinine, Ser: 1.2 mg/dL (ref 0.61–1.24)
Glucose, Bld: 82 mg/dL (ref 70–99)
HCT: 52 % (ref 39.0–52.0)
Hemoglobin: 17.7 g/dL — ABNORMAL HIGH (ref 13.0–17.0)
Potassium: 5.5 mmol/L — ABNORMAL HIGH (ref 3.5–5.1)
Sodium: 138 mmol/L (ref 135–145)
TCO2: 29 mmol/L (ref 22–32)

## 2021-07-02 LAB — RAPID URINE DRUG SCREEN, HOSP PERFORMED
Amphetamines: NOT DETECTED
Barbiturates: NOT DETECTED
Benzodiazepines: NOT DETECTED
Cocaine: NOT DETECTED
Opiates: NOT DETECTED
Tetrahydrocannabinol: NOT DETECTED

## 2021-07-02 LAB — RESP PANEL BY RT-PCR (FLU A&B, COVID) ARPGX2
Influenza A by PCR: NEGATIVE
Influenza B by PCR: NEGATIVE
SARS Coronavirus 2 by RT PCR: NEGATIVE

## 2021-07-02 LAB — APTT: aPTT: 30 seconds (ref 24–36)

## 2021-07-02 LAB — CK: Total CK: 142 U/L (ref 49–397)

## 2021-07-02 LAB — PROTIME-INR
INR: 0.9 (ref 0.8–1.2)
Prothrombin Time: 12.2 seconds (ref 11.4–15.2)

## 2021-07-02 MED ORDER — SODIUM CHLORIDE 0.9 % IV BOLUS
1000.0000 mL | Freq: Once | INTRAVENOUS | Status: AC
  Administered 2021-07-02: 1000 mL via INTRAVENOUS

## 2021-07-02 MED ORDER — GADOBUTROL 1 MMOL/ML IV SOLN
5.5000 mL | Freq: Once | INTRAVENOUS | Status: AC | PRN
  Administered 2021-07-02: 5.5 mL via INTRAVENOUS

## 2021-07-02 MED ORDER — PREDNISONE 20 MG PO TABS: ORAL_TABLET | ORAL | 0 refills | Status: DC

## 2021-07-02 MED ORDER — METHYLPREDNISOLONE SODIUM SUCC 125 MG IJ SOLR
125.0000 mg | Freq: Once | INTRAMUSCULAR | Status: AC
  Administered 2021-07-02: 125 mg via INTRAVENOUS
  Filled 2021-07-02: qty 2

## 2021-07-02 NOTE — Discharge Instructions (Addendum)
Take prednisone as prescribed  Your weakness is from a pinched nerve in your neck.  You need to talk to Dr. Conchita Paris again regarding surgery  Return to ER if you have worse neck pain, weakness, trouble walking or trouble speaking.

## 2021-07-02 NOTE — Patient Instructions (Signed)
I am concerned you have had a stroke in the past two weeks  We are sending you to [place] for evaluations

## 2021-07-02 NOTE — ED Notes (Signed)
Patient transported to MRI 

## 2021-07-02 NOTE — ED Notes (Signed)
Pt states left leg is weak when he stands. Pt used the urinal and stood up fine with no assistance.

## 2021-07-02 NOTE — Assessment & Plan Note (Signed)
Acute left-sided weakness of approximately 2 weeks duration  I am concerned about a stroke has occurred for this patient  He could have spinal column disease as well but needs to be ruled out  Blood pressure is good but he is still actively smoking  I called EMS and transported him by emergency transport to our hospital across the street for emergency evaluation I did call the emergency room they know he is coming

## 2021-07-02 NOTE — ED Provider Notes (Signed)
[facility] EMERGENCY DEPARTMENT Provider Note   CSN: [id number] Arrival date & time: 07/02/21  1526     History  No chief complaint on file.   [name] [name] is a 61 y.o. male here presenting with left-sided weakness.  Patient has been having left-sided weakness for the last 2 weeks or so.  Patient initially thought his muscles are sore.  He thought that he strained his neck and started to have left-sided arm and leg weakness.  However since it has not resolved, he went to see his doctor today.  His doctor was concerned because he is weak on the left side and that he may have a stroke or multiple sclerosis.  Denies any blurry vision.   The history is provided by the patient.      Home Medications Prior to Admission medications   Medication Sig Start Date End Date Taking? Authorizing Provider  acetaminophen (TYLENOL) 325 MG tablet Take 325 mg by mouth every 6 (six) hours as needed for mild pain.    [provider]  amLODipine (NORVASC) 5 MG tablet TAKE 1 TABLET (5 MG TOTAL) BY MOUTH DAILY. TO LOWER BLOOD PRESSURE 03/27/21 03/27/22  Marcine Matar, MD  hydrocortisone 2.5 % cream Apply topically 2 (two) times daily. 10/28/16   Lizbeth Bark, FNP  nicotine (NICODERM CQ - DOSED IN MG/24 HOURS) 21 mg/24hr patch Place 1 patch (21 mg total) onto the skin daily. 08/19/18   Fulp, Cammie, MD  nicotine polacrilex (COMMIT) 4 MG lozenge Take 1 lozenge (4 mg total) by mouth as needed for smoking cessation. 08/19/18   Fulp, Hewitt Shorts, MD  Nutritional Supplements (ENSURE ACTIVE) LIQD Take 1 Bottle by mouth daily as needed. 10/09/16   Lizbeth Bark, FNP  omeprazole (PRILOSEC) 40 MG capsule TAKE 1 CAPSULE (40 MG TOTAL) BY MOUTH 2 (TWO) TIMES DAILY. TO REDUCE STOMACH ACID 03/19/20 03/19/21  Fulp, Cammie, MD  tiZANidine (ZANAFLEX) 4 MG tablet TAKE 1 TABLET (4 MG TOTAL) BY MOUTH AT BEDTIME AS NEEDED FOR MUSCLE SPASMS. 04/01/21 04/01/22  Hoy Register, MD      Allergies     Chantix [varenicline tartrate]    Review of Systems   Review of Systems  Neurological:  Positive for dizziness and weakness.  All other systems reviewed and are negative.  Physical Exam Updated Vital Signs BP 129/74 (BP Location: Right Arm)    Pulse 61    Temp 98.5 F (36.9 C) (Oral)    Resp 19    SpO2 99%  Physical Exam Vitals and nursing note reviewed.  HENT:     Head: Normocephalic.     Nose: Nose normal.     Mouth/Throat:     Mouth: Mucous membranes are moist.  Eyes:     Extraocular Movements: Extraocular movements intact.     Pupils: Pupils are equal, round, and reactive to light.  Cardiovascular:     Rate and Rhythm: Normal rate and regular rhythm.     Pulses: Normal pulses.     Heart sounds: Normal heart sounds.  Pulmonary:     Effort: Pulmonary effort is normal.     Breath sounds: Normal breath sounds.  Abdominal:     General: Abdomen is flat.     Palpations: Abdomen is soft.  Musculoskeletal:        General: Normal range of motion.     Cervical back: Normal range of motion and neck supple.  Skin:    General: Skin is warm.  Capillary Refill: Capillary refill takes less than 2 seconds.  Neurological:     Mental Status: He is alert.     Comments: Patient has strength 4/5 left arm and leg.  Patient has normal sensation bilateral arms and legs.  No obvious facial droop.  No obvious slurred speech  Psychiatric:        Mood and Affect: Mood normal.        Behavior: Behavior normal.    ED Results / Procedures / Treatments   Labs (all labs ordered are listed, but only abnormal results are displayed) Labs Reviewed  RESP PANEL BY RT-PCR (FLU A&B, COVID) ARPGX2  ETHANOL  PROTIME-INR  APTT  CBC  DIFFERENTIAL  COMPREHENSIVE METABOLIC PANEL  RAPID URINE DRUG SCREEN, HOSP PERFORMED  URINALYSIS, ROUTINE W REFLEX MICROSCOPIC  CK  I-STAT CHEM 8, ED    EKG EKG Interpretation  Date/Time:  Tuesday July 02 2021 15:32:42 EST Ventricular Rate:  62 PR  Interval:  161 QRS Duration: 108 QT Interval:  388 QTC Calculation: 394 R Axis:   45 Text Interpretation: Sinus rhythm RSR' in V1 or V2, probably normal variant Borderline ST elevation, anterior leads No significant change since last tracing Confirmed by Richardean Canal [phone]) on 07/02/2021 4:31:36 PM  Radiology No results found.  Procedures Procedures    Medications Ordered in ED Medications  sodium chloride 0.9 % bolus 1,000 mL (has no administration in time range)    ED Course/ Medical Decision Making/ A&P                           Medical Decision Making Devere Brem is a 61 y.o. male presenting with left arm and leg weakness for 2 weeks.  Patient is weak on the left side.  Since his symptoms are going on for 2 weeks, he is definitely outside the window for any intervention.  I am concerned for possible stroke versus multiple sclerosis.  We will get MRI with and without of the brain and cervical spine.  We will also get CT head and neck as well. 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  Patient is dragging on the left feet.  I talked to patient further.  He states that he actually saw Dr. Conchita Paris previously.  Dr. Conchita Paris recommended surgery but he declined.  He has been followed with Dr. Roda Shutters from orthopedic surgery and has been getting cortisone shots.  Per Dr. Maurice Small from neurosurgery, this is an ongoing issue.  Patient has been weak on the left side chronically.  He states that, if the patient is not in too much pain, patient can be discharged with a course of steroids and follow-up with Dr. Conchita Paris.  Patient states that he still is hesitant about surgery and would rather try a course of steroids.      Problems Addressed: Cervical stenosis of spine: acute illness or injury  Amount and/or Complexity of  Data Reviewed Labs: ordered. Decision-making details documented in ED Course. Radiology: ordered and independent interpretation performed. Decision-making details documented in ED Course.  Risk Prescription drug management.  Final Clinical Impression(s) / ED Diagnoses Final diagnoses:  None    Rx / DC Orders ED Discharge Orders     None         Charlynne Pander, MD 07/02/21  2035 ° °

## 2021-07-02 NOTE — ED Triage Notes (Signed)
[facility] by [name] with complaint of left arm and left leg weakness x 2 weeks. Complains of pain with use of either extremity. Alert and oriented, speech clear
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
## 2021-07-03 ENCOUNTER — Telehealth: Payer: Self-pay | Admitting: Critical Care Medicine

## 2021-07-03 DIAGNOSIS — M4802 Spinal stenosis, cervical region: Secondary | ICD-10-CM | POA: Insufficient documentation

## 2021-07-03 DIAGNOSIS — M50023 Cervical disc disorder at C6-C7 level with myelopathy: Secondary | ICD-10-CM | POA: Insufficient documentation

## 2021-07-03 DIAGNOSIS — R531 Weakness: Secondary | ICD-10-CM

## 2021-07-03 DIAGNOSIS — M5412 Radiculopathy, cervical region: Secondary | ICD-10-CM

## 2021-07-03 NOTE — Telephone Encounter (Signed)
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 ?Pt aware and willing to go ? ?Needs medicare transport, jane I am including you ? ?We need to ensure HE ATTENDS THIS APPT ? ?If not he WILL BECOME A QUADROPLEGIC SOON ? ?See MRI just done ? ?NORA ORDERS PUT IN URGENT  ? ?Erskine Squibb we need a [place] in person interpreter to go with him  he speaks english but I think language has caused him to misunderstand prior recs  this surgery was recommended in 2018 and HE DID NOT COMPLY ? ? ?

## 2021-07-04 NOTE — Telephone Encounter (Signed)
[name] [name], please let me know when the referral is placed.  ?

## 2021-07-16 NOTE — Telephone Encounter (Signed)
Call placed to patient and he confirmed that he has scheduled a ride to his neurosurgery appointment tomorrow. He arranged the ride through [facility] and he denied any need for an interpreter to be present during the appointment.  ?

## 2021-07-17 DIAGNOSIS — G992 Myelopathy in diseases classified elsewhere: Secondary | ICD-10-CM | POA: Diagnosis not present

## 2021-07-17 DIAGNOSIS — R6889 Other general symptoms and signs: Secondary | ICD-10-CM | POA: Diagnosis not present

## 2021-07-17 DIAGNOSIS — M4802 Spinal stenosis, cervical region: Secondary | ICD-10-CM | POA: Diagnosis not present

## 2021-07-18 ENCOUNTER — Other Ambulatory Visit: Payer: Self-pay | Admitting: Neurosurgery

## 2021-07-22 ENCOUNTER — Other Ambulatory Visit: Payer: Self-pay | Admitting: Neurosurgery

## 2021-07-24 ENCOUNTER — Telehealth: Payer: Self-pay | Admitting: *Deleted

## 2021-07-24 NOTE — Telephone Encounter (Signed)
I do not know about a scooter ?

## 2021-07-24 NOTE — Telephone Encounter (Signed)
Copied from [place] [id number]. Topic: General - Call Back - No Documentation ?>> Jul 22, 2021 10:39 AM Erick Blinks wrote: ?Reason for CRM: Pt declined appt offer but he wants to talk to his PCP about a scooter. He says his insurance is waiting on his PCP to approve his scooter. Please advise ? ?[phone] ?

## 2021-07-24 NOTE — Telephone Encounter (Signed)
I looked through the notes from last visit didn't see anything for a scooter. If not talked about, he will need an appointment for this. Just wanted to run by you incase you all did talk with him/somebody. ? ? ? ?

## 2021-07-25 NOTE — Telephone Encounter (Signed)
Called pt and schedule appt for this   ?

## 2021-07-30 ENCOUNTER — Ambulatory Visit: Payer: Medicare HMO | Admitting: Critical Care Medicine

## 2021-07-30 NOTE — Pre-Procedure Instructions (Signed)
Surgical Instructions ? ? ? Your procedure is scheduled on Tuesday, April 4th. ? Report to [facility] Main Entrance "A" at 9:40 A.M., then check in with the Admitting office. ? Call this number if you have problems the morning of surgery: ? [phone] ? ? If you have any questions prior to your surgery date call [phone]: Open Monday-Friday 8am-4pm ? ? ? Remember: ? Do not eat after midnight the night before your surgery ? ?You may drink clear liquids until 8:40 a.m. the morning of your surgery.   ?Clear liquids allowed are: Water, Non-Citrus Juices (without pulp), Carbonated Beverages, Clear Tea, Black Coffee ONLY (NO MILK, CREAM OR POWDERED CREAMER of any kind), and Gatorade ?  ? Take these medicines the morning of surgery with A SIP OF WATER:  ?amLODipine (NORVASC)  ? ?As of today, STOP taking any Aspirin (unless otherwise instructed by your surgeon) Aleve, Naproxen, Ibuprofen, Motrin, Advil, Goody's, BC's, all herbal medications, fish oil, and all vitamins. ? ?         ?Do not wear jewelry. ?Do not wear lotions, powders, colognes, or deodorant. ?Men may shave face and neck. ?Do not bring valuables to the hospital. ? ?[facility] is not responsible for any belongings or valuables. .  ? ?Do NOT Smoke (Tobacco/Vaping)  24 hours prior to your procedure ? ?If you use a CPAP at night, you may bring your mask for your overnight stay. ?  ?Contacts, glasses, hearing aids, dentures or partials may not be worn into surgery, please bring cases for these belongings ?  ?For patients admitted to the hospital, discharge time will be determined by your treatment team. ?  ?Patients discharged the day of surgery will not be allowed to drive home, and someone needs to stay with them for 24 hours. ? ? ?SURGICAL WAITING ROOM VISITATION ?Patients having surgery or a procedure in a hospital may have two support people. ?Children under the age of 61 must have an adult with them who is not the patient. ?They may stay in the waiting  area during the procedure and may switch out with other visitors. If the patient needs to stay at the hospital during part of their recovery, the visitor guidelines for inpatient rooms apply. ? ?Please refer to the [facility] website for the visitor guidelines for Inpatients (after your surgery is over and you are in a regular room).  ? ? ? ? ? ?Special instructions:   ? ?Oral Hygiene is also important to reduce your risk of infection.  Remember - BRUSH YOUR TEETH THE MORNING OF SURGERY WITH YOUR REGULAR TOOTHPASTE ? ? ?[place]- Preparing For Surgery ? ?Before surgery, you can play an important role. Because skin is not sterile, your skin needs to be as free of germs as possible. You can reduce the number of germs on your skin by washing with CHG (chlorahexidine gluconate) Soap before surgery.  CHG is an antiseptic cleaner which kills germs and bonds with the skin to continue killing germs even after washing.   ? ? ?Please do not use if you have an allergy to CHG or antibacterial soaps. If your skin becomes reddened/irritated stop using the CHG.  ?Do not shave (including legs and underarms) for at least 48 hours prior to first CHG shower. It is OK to shave your face. ? ?Please follow these instructions carefully. ?  ? ? Shower the NIGHT BEFORE SURGERY and the MORNING OF SURGERY with CHG Soap.  ? If you chose to wash your  hair, wash your hair first as usual with your normal shampoo. After you shampoo, rinse your hair and body thoroughly to remove the shampoo.  Then [employer] and genitals (private parts) with your normal soap and rinse thoroughly to remove soap. ? ?After that Use CHG Soap as you would any other liquid soap. You can apply CHG directly to the skin and wash gently with a scrungie or a clean washcloth.  ? ?Apply the CHG Soap to your body ONLY FROM THE NECK DOWN.  Do not use on open wounds or open sores. Avoid contact with your eyes, ears, mouth and genitals (private parts). Wash Face and genitals  (private parts)  with your normal soap.  ? ?Wash thoroughly, paying special attention to the area where your surgery will be performed. ? ?Thoroughly rinse your body with warm water from the neck down. ? ?DO NOT shower/wash with your normal soap after using and rinsing off the CHG Soap. ? ?Pat yourself dry with a CLEAN TOWEL. ? ?Wear CLEAN PAJAMAS to bed the night before surgery ? ?Place CLEAN SHEETS on your bed the night before your surgery ? ?DO NOT SLEEP WITH PETS. ? ? ?Day of Surgery: ? ?Take a shower with CHG soap. ?Wear Clean/Comfortable clothing the morning of surgery ?Do not apply any deodorants/lotions.   ?Remember to brush your teeth WITH YOUR REGULAR TOOTHPASTE. ? ? ? ?If you received a COVID test during your pre-op visit  it is requested that you wear a mask when out in public, stay away from anyone that may not be feeling well and notify your surgeon if you develop symptoms. If you have been in contact with anyone that has tested positive in the last 10 days please notify you surgeon. ? ?  ?Please read over the following fact sheets that you were given.  ? ?

## 2021-07-31 ENCOUNTER — Encounter (HOSPITAL_COMMUNITY): Payer: Self-pay

## 2021-07-31 ENCOUNTER — Other Ambulatory Visit: Payer: Self-pay

## 2021-07-31 ENCOUNTER — Encounter (HOSPITAL_COMMUNITY)
Admission: RE | Admit: 2021-07-31 | Discharge: 2021-07-31 | Disposition: A | Discharge status: Home / Self Care | Payer: Medicare HMO | Source: Ambulatory Visit | Attending: Neurosurgery | Admitting: Neurosurgery

## 2021-07-31 VITALS — BP 142/73 | HR 72 | Temp 97.8°F | Resp 18 | Ht 60.0 in | Wt 121.1 lb

## 2021-07-31 DIAGNOSIS — Z01812 Encounter for preprocedural laboratory examination: Secondary | ICD-10-CM | POA: Diagnosis not present

## 2021-07-31 DIAGNOSIS — Z01818 Encounter for other preprocedural examination: Secondary | ICD-10-CM

## 2021-07-31 DIAGNOSIS — R6889 Other general symptoms and signs: Secondary | ICD-10-CM | POA: Diagnosis not present

## 2021-07-31 LAB — TYPE AND SCREEN
ABO/RH(D): O POS
Antibody Screen: NEGATIVE

## 2021-07-31 LAB — CBC
HCT: 45.1 % (ref 39.0–52.0)
Hemoglobin: 14.5 g/dL (ref 13.0–17.0)
MCH: 26.7 pg (ref 26.0–34.0)
MCHC: 32.2 g/dL (ref 30.0–36.0)
MCV: 82.9 fL (ref 80.0–100.0)
Platelets: 213 10*3/uL (ref 150–400)
RBC: 5.44 MIL/uL (ref 4.22–5.81)
RDW: 14.5 % (ref 11.5–15.5)
WBC: 9.6 10*3/uL (ref 4.0–10.5)
nRBC: 0 % (ref 0.0–0.2)

## 2021-07-31 LAB — BASIC METABOLIC PANEL
Anion gap: 7 (ref 5–15)
BUN: 18 mg/dL (ref 8–23)
CO2: 28 mmol/L (ref 22–32)
Calcium: 9.4 mg/dL (ref 8.9–10.3)
Chloride: 106 mmol/L (ref 98–111)
Creatinine, Ser: 1.13 mg/dL (ref 0.61–1.24)
GFR, Estimated: >60 mL/min (ref >60)
Glucose, Bld: 142 mg/dL — ABNORMAL HIGH (ref 70–99)
Potassium: 3.6 mmol/L (ref 3.5–5.1)
Sodium: 141 mmol/L (ref 135–145)

## 2021-07-31 LAB — SURGICAL PCR SCREEN
MRSA, PCR: NEGATIVE
Staphylococcus aureus: NEGATIVE

## 2021-07-31 NOTE — Progress Notes (Signed)
PCP - Dr.Patrick Delford Field ?Cardiologist - denies ? ?PPM/ICD -denies ?Device Orders -  ?Rep Notified -  ? ?Chest x-ray - n/a ?EKG - 07/02/21 ?Stress Test - none ?ECHO - none ?Cardiac Cath - none ? ?Sleep Study - none ?CPAP -  ? ?Fasting Blood Sugar - n/a ?Checks Blood Sugar _____ times a day ? ?Blood Thinner Instructions:n/a ?Aspirin Instructions:n/a ? ?ERAS Protcol -clear liquids until 0840 ?PRE-SURGERY Ensure or G2- no ? ?COVID TEST- n/a ? ? ?Anesthesia review: no ? ?Patient denies shortness of breath, fever, cough and chest pain at PAT appointment ? ? ?All instructions explained to the patient, with a verbal understanding of the material. Patient agrees to go over the instructions while at home for a better understanding. Patient also instructed to wear a mask when out in public prior to surgery.  The opportunity to ask questions was provided. ?  ?

## 2021-08-06 ENCOUNTER — Other Ambulatory Visit: Payer: Self-pay

## 2021-08-06 ENCOUNTER — Encounter (HOSPITAL_COMMUNITY): Payer: Self-pay | Admitting: Neurosurgery

## 2021-08-06 ENCOUNTER — Inpatient Hospital Stay (HOSPITAL_COMMUNITY)
Admission: RE | Disposition: A | Discharge status: Home / Self Care | Payer: Self-pay | Source: Home / Self Care | Attending: Neurosurgery

## 2021-08-06 ENCOUNTER — Inpatient Hospital Stay (HOSPITAL_COMMUNITY): Payer: Medicare HMO

## 2021-08-06 ENCOUNTER — Inpatient Hospital Stay (HOSPITAL_COMMUNITY)
Admission: RE | Admit: 2021-08-06 | Discharge: 2021-08-07 | DRG: 472 | Disposition: A | Discharge status: Home / Self Care | Payer: Medicare HMO | Attending: Neurosurgery | Admitting: Neurosurgery

## 2021-08-06 ENCOUNTER — Inpatient Hospital Stay (HOSPITAL_COMMUNITY): Payer: Medicare HMO | Admitting: Anesthesiology

## 2021-08-06 DIAGNOSIS — G992 Myelopathy in diseases classified elsewhere: Secondary | ICD-10-CM | POA: Diagnosis present

## 2021-08-06 DIAGNOSIS — Z79899 Other long term (current) drug therapy: Secondary | ICD-10-CM | POA: Diagnosis not present

## 2021-08-06 DIAGNOSIS — R6889 Other general symptoms and signs: Secondary | ICD-10-CM | POA: Diagnosis not present

## 2021-08-06 DIAGNOSIS — M40202 Unspecified kyphosis, cervical region: Secondary | ICD-10-CM | POA: Diagnosis not present

## 2021-08-06 DIAGNOSIS — F17210 Nicotine dependence, cigarettes, uncomplicated: Secondary | ICD-10-CM | POA: Diagnosis not present

## 2021-08-06 DIAGNOSIS — Z888 Allergy status to other drugs, medicaments and biological substances status: Secondary | ICD-10-CM

## 2021-08-06 DIAGNOSIS — I10 Essential (primary) hypertension: Secondary | ICD-10-CM | POA: Diagnosis not present

## 2021-08-06 DIAGNOSIS — M4802 Spinal stenosis, cervical region: Secondary | ICD-10-CM

## 2021-08-06 DIAGNOSIS — M4322 Fusion of spine, cervical region: Secondary | ICD-10-CM | POA: Diagnosis not present

## 2021-08-06 DIAGNOSIS — G959 Disease of spinal cord, unspecified: Secondary | ICD-10-CM | POA: Diagnosis not present

## 2021-08-06 DIAGNOSIS — Z981 Arthrodesis status: Secondary | ICD-10-CM | POA: Diagnosis not present

## 2021-08-06 HISTORY — PX: ANTERIOR CERVICAL DECOMPRESSION/DISCECTOMY FUSION 4 LEVELS: SHX5556

## 2021-08-06 LAB — ABO/RH: ABO/RH(D): O POS

## 2021-08-06 SURGERY — ANTERIOR CERVICAL DECOMPRESSION/DISCECTOMY FUSION 4 LEVELS
Anesthesia: General

## 2021-08-06 MED ORDER — LACTATED RINGERS IV SOLN: INTRAVENOUS | Status: DC

## 2021-08-06 MED ORDER — PHENYLEPHRINE HCL-NACL 20-0.9 MG/250ML-% IV SOLN
INTRAVENOUS | Status: DC | PRN
  Administered 2021-08-06: 20 ug/min via INTRAVENOUS

## 2021-08-06 MED ORDER — AMLODIPINE BESYLATE 5 MG PO TABS
5.0000 mg | ORAL_TABLET | Freq: Every day | ORAL | Status: DC
  Administered 2021-08-06 – 2021-08-07 (×2): 5 mg via ORAL
  Filled 2021-08-06 (×2): qty 1

## 2021-08-06 MED ORDER — ROCURONIUM BROMIDE 100 MG/10ML IV SOLN
INTRAVENOUS | Status: DC | PRN
  Administered 2021-08-06: 60 mg via INTRAVENOUS
  Administered 2021-08-06 (×2): 20 mg via INTRAVENOUS
  Administered 2021-08-06: 40 mg via INTRAVENOUS

## 2021-08-06 MED ORDER — HYDROMORPHONE HCL 1 MG/ML IJ SOLN
INTRAMUSCULAR | Status: DC | PRN
Start: 2021-08-06 — End: 2021-08-06
  Administered 2021-08-06: .5 mg via INTRAVENOUS

## 2021-08-06 MED ORDER — ONDANSETRON HCL 4 MG PO TABS: 4.0000 mg | ORAL_TABLET | Freq: Four times a day (QID) | ORAL | Status: DC | PRN

## 2021-08-06 MED ORDER — SODIUM CHLORIDE 0.9% FLUSH
3.0000 mL | INTRAVENOUS | Status: DC | PRN
Start: 2021-08-06 — End: 2021-08-07

## 2021-08-06 MED ORDER — LIDOCAINE 2% (20 MG/ML) 5 ML SYRINGE
INTRAMUSCULAR | Status: AC
  Filled 2021-08-06: qty 10

## 2021-08-06 MED ORDER — LIDOCAINE-EPINEPHRINE 1 %-1:100000 IJ SOLN
INTRAMUSCULAR | Status: AC
  Filled 2021-08-06: qty 1

## 2021-08-06 MED ORDER — THROMBIN (RECOMBINANT) 5000 UNITS EX SOLR
CUTANEOUS | Status: AC
  Filled 2021-08-06: qty 5000

## 2021-08-06 MED ORDER — CEFAZOLIN SODIUM 1 G IJ SOLR
INTRAMUSCULAR | Status: AC
  Filled 2021-08-06: qty 20

## 2021-08-06 MED ORDER — MIDAZOLAM HCL 2 MG/2ML IJ SOLN
INTRAMUSCULAR | Status: AC
  Filled 2021-08-06: qty 2

## 2021-08-06 MED ORDER — BUPIVACAINE HCL 0.5 % IJ SOLN
INTRAMUSCULAR | Status: DC | PRN
Start: 2021-08-06 — End: 2021-08-06
  Administered 2021-08-06: 4.5 mL

## 2021-08-06 MED ORDER — ORAL CARE MOUTH RINSE: 15.0000 mL | Freq: Once | OROMUCOSAL | Status: AC

## 2021-08-06 MED ORDER — STERILE WATER FOR IRRIGATION IR SOLN
Status: DC | PRN
  Administered 2021-08-06: 1000 mL

## 2021-08-06 MED ORDER — DEXAMETHASONE SODIUM PHOSPHATE 10 MG/ML IJ SOLN
INTRAMUSCULAR | Status: AC
  Filled 2021-08-06: qty 1

## 2021-08-06 MED ORDER — DEXMEDETOMIDINE (PRECEDEX) IN NS 20 MCG/5ML (4 MCG/ML) IV SYRINGE
PREFILLED_SYRINGE | INTRAVENOUS | Status: AC
  Filled 2021-08-06: qty 5

## 2021-08-06 MED ORDER — FLEET ENEMA 7-19 GM/118ML RE ENEM: 1.0000 | ENEMA | Freq: Once | RECTAL | Status: DC | PRN

## 2021-08-06 MED ORDER — CELECOXIB 200 MG PO CAPS
ORAL_CAPSULE | ORAL | Status: AC
  Administered 2021-08-06: 200 mg via ORAL
  Filled 2021-08-06: qty 1

## 2021-08-06 MED ORDER — METHOCARBAMOL 1000 MG/10ML IJ SOLN
500.0000 mg | Freq: Four times a day (QID) | INTRAVENOUS | Status: DC | PRN
  Filled 2021-08-06: qty 5

## 2021-08-06 MED ORDER — SODIUM CHLORIDE 0.9% FLUSH
3.0000 mL | Freq: Two times a day (BID) | INTRAVENOUS | Status: DC
  Administered 2021-08-06: 3 mL via INTRAVENOUS

## 2021-08-06 MED ORDER — FENTANYL CITRATE (PF) 100 MCG/2ML IJ SOLN
INTRAMUSCULAR | Status: DC | PRN
Start: 2021-08-06 — End: 2021-08-06
  Administered 2021-08-06: 20 ug via INTRAVENOUS
  Administered 2021-08-06: 50 ug via INTRAVENOUS
  Administered 2021-08-06 (×3): 20 ug via INTRAVENOUS
  Administered 2021-08-06: 100 ug via INTRAVENOUS
  Administered 2021-08-06: 20 ug via INTRAVENOUS

## 2021-08-06 MED ORDER — OXYCODONE HCL 5 MG PO TABS
10.0000 mg | ORAL_TABLET | ORAL | Status: DC | PRN
  Administered 2021-08-06 – 2021-08-07 (×5): 10 mg via ORAL
  Filled 2021-08-06 (×5): qty 2

## 2021-08-06 MED ORDER — OXYCODONE HCL 5 MG PO TABS: 5.0000 mg | ORAL_TABLET | ORAL | Status: DC | PRN

## 2021-08-06 MED ORDER — SENNA 8.6 MG PO TABS
1.0000 | ORAL_TABLET | Freq: Two times a day (BID) | ORAL | Status: DC
  Administered 2021-08-06 – 2021-08-07 (×2): 8.6 mg via ORAL
  Filled 2021-08-06 (×2): qty 1

## 2021-08-06 MED ORDER — PHENYLEPHRINE 40 MCG/ML (10ML) SYRINGE FOR IV PUSH (FOR BLOOD PRESSURE SUPPORT)
PREFILLED_SYRINGE | INTRAVENOUS | Status: AC
  Filled 2021-08-06: qty 10

## 2021-08-06 MED ORDER — METHOCARBAMOL 500 MG PO TABS
500.0000 mg | ORAL_TABLET | Freq: Four times a day (QID) | ORAL | Status: DC | PRN
  Administered 2021-08-06 – 2021-08-07 (×2): 500 mg via ORAL
  Filled 2021-08-06 (×2): qty 1

## 2021-08-06 MED ORDER — CELECOXIB 200 MG PO CAPS: 200.0000 mg | ORAL_CAPSULE | Freq: Once | ORAL | Status: AC

## 2021-08-06 MED ORDER — DEXAMETHASONE SODIUM PHOSPHATE 4 MG/ML IJ SOLN
INTRAMUSCULAR | Status: DC | PRN
  Administered 2021-08-06: 100 mg via INTRAVENOUS
  Administered 2021-08-06: 10 mg via INTRAVENOUS

## 2021-08-06 MED ORDER — BUPIVACAINE HCL (PF) 0.5 % IJ SOLN
INTRAMUSCULAR | Status: AC
  Filled 2021-08-06: qty 30

## 2021-08-06 MED ORDER — CHLORHEXIDINE GLUCONATE 0.12 % MT SOLN: 15.0000 mL | Freq: Once | OROMUCOSAL | Status: AC

## 2021-08-06 MED ORDER — MIDAZOLAM HCL 5 MG/5ML IJ SOLN
INTRAMUSCULAR | Status: DC | PRN
  Administered 2021-08-06: 2 mg via INTRAVENOUS

## 2021-08-06 MED ORDER — CEFAZOLIN SODIUM-DEXTROSE 2-4 GM/100ML-% IV SOLN
2.0000 g | INTRAVENOUS | Status: AC
  Administered 2021-08-06: 2 g via INTRAVENOUS

## 2021-08-06 MED ORDER — ACETAMINOPHEN 650 MG RE SUPP: 650.0000 mg | RECTAL | Status: DC | PRN

## 2021-08-06 MED ORDER — PANTOPRAZOLE SODIUM 40 MG IV SOLR
40.0000 mg | Freq: Every day | INTRAVENOUS | Status: DC
  Administered 2021-08-06: 40 mg via INTRAVENOUS
  Filled 2021-08-06: qty 10

## 2021-08-06 MED ORDER — PROPOFOL 10 MG/ML IV BOLUS
INTRAVENOUS | Status: AC
  Filled 2021-08-06: qty 20

## 2021-08-06 MED ORDER — ONDANSETRON HCL 4 MG/2ML IJ SOLN
INTRAMUSCULAR | Status: DC | PRN
  Administered 2021-08-06: 4 mg via INTRAVENOUS

## 2021-08-06 MED ORDER — ACETAMINOPHEN 500 MG PO TABS: 1000.0000 mg | ORAL_TABLET | Freq: Once | ORAL | Status: AC

## 2021-08-06 MED ORDER — THROMBIN 5000 UNITS EX SOLR
CUTANEOUS | Status: AC
  Filled 2021-08-06: qty 5000

## 2021-08-06 MED ORDER — MORPHINE SULFATE (PF) 2 MG/ML IV SOLN: 2.0000 mg | INTRAVENOUS | Status: DC | PRN

## 2021-08-06 MED ORDER — PHENOL 1.4 % MT LIQD: 1.0000 | OROMUCOSAL | Status: DC | PRN

## 2021-08-06 MED ORDER — CEFAZOLIN SODIUM-DEXTROSE 2-4 GM/100ML-% IV SOLN
INTRAVENOUS | Status: AC
  Filled 2021-08-06: qty 100

## 2021-08-06 MED ORDER — SODIUM CHLORIDE 0.9 % IV SOLN: INTRAVENOUS | Status: DC

## 2021-08-06 MED ORDER — HYDROMORPHONE HCL 1 MG/ML IJ SOLN
INTRAMUSCULAR | Status: AC
  Filled 2021-08-06: qty 0.5

## 2021-08-06 MED ORDER — BISACODYL 10 MG RE SUPP: 10.0000 mg | Freq: Every day | RECTAL | Status: DC | PRN

## 2021-08-06 MED ORDER — LIDOCAINE 2% (20 MG/ML) 5 ML SYRINGE
INTRAMUSCULAR | Status: DC | PRN
  Administered 2021-08-06: 100 mg via INTRAVENOUS

## 2021-08-06 MED ORDER — MENTHOL 3 MG MT LOZG: 1.0000 | LOZENGE | OROMUCOSAL | Status: DC | PRN

## 2021-08-06 MED ORDER — CHLORHEXIDINE GLUCONATE CLOTH 2 % EX PADS: 6.0000 | MEDICATED_PAD | Freq: Once | CUTANEOUS | Status: DC

## 2021-08-06 MED ORDER — LIDOCAINE-EPINEPHRINE 1 %-1:100000 IJ SOLN
INTRAMUSCULAR | Status: DC | PRN
  Administered 2021-08-06: 4.5 mL

## 2021-08-06 MED ORDER — CEFAZOLIN SODIUM-DEXTROSE 2-4 GM/100ML-% IV SOLN
2.0000 g | Freq: Three times a day (TID) | INTRAVENOUS | Status: AC
  Administered 2021-08-06 – 2021-08-07 (×2): 2 g via INTRAVENOUS
  Filled 2021-08-06 (×2): qty 100

## 2021-08-06 MED ORDER — CHLORHEXIDINE GLUCONATE 0.12 % MT SOLN
OROMUCOSAL | Status: AC
  Administered 2021-08-06: 15 mL via OROMUCOSAL
  Filled 2021-08-06: qty 15

## 2021-08-06 MED ORDER — DOCUSATE SODIUM 100 MG PO CAPS
100.0000 mg | ORAL_CAPSULE | Freq: Two times a day (BID) | ORAL | Status: DC
  Administered 2021-08-06 – 2021-08-07 (×2): 100 mg via ORAL
  Filled 2021-08-06 (×2): qty 1

## 2021-08-06 MED ORDER — SODIUM CHLORIDE 0.9 % IV SOLN: 250.0000 mL | INTRAVENOUS | Status: DC

## 2021-08-06 MED ORDER — SUGAMMADEX SODIUM 200 MG/2ML IV SOLN
INTRAVENOUS | Status: DC | PRN
  Administered 2021-08-06: 109.8 mg via INTRAVENOUS

## 2021-08-06 MED ORDER — 0.9 % SODIUM CHLORIDE (POUR BTL) OPTIME
TOPICAL | Status: DC | PRN
  Administered 2021-08-06: 1000 mL

## 2021-08-06 MED ORDER — LIDOCAINE 2% (20 MG/ML) 5 ML SYRINGE
INTRAMUSCULAR | Status: AC
  Filled 2021-08-06: qty 5

## 2021-08-06 MED ORDER — POLYETHYLENE GLYCOL 3350 17 G PO PACK: 17.0000 g | PACK | Freq: Every day | ORAL | Status: DC | PRN

## 2021-08-06 MED ORDER — ONDANSETRON HCL 4 MG/2ML IJ SOLN: 4.0000 mg | Freq: Four times a day (QID) | INTRAMUSCULAR | Status: DC | PRN

## 2021-08-06 MED ORDER — FENTANYL CITRATE (PF) 250 MCG/5ML IJ SOLN
INTRAMUSCULAR | Status: AC
Start: 2021-08-06 — End: ?
  Filled 2021-08-06: qty 5

## 2021-08-06 MED ORDER — AMISULPRIDE (ANTIEMETIC) 5 MG/2ML IV SOLN: 10.0000 mg | Freq: Once | INTRAVENOUS | Status: DC | PRN

## 2021-08-06 MED ORDER — THROMBIN 5000 UNITS EX SOLR
OROMUCOSAL | Status: DC | PRN
  Administered 2021-08-06 (×2): 5 mL via TOPICAL

## 2021-08-06 MED ORDER — ROCURONIUM BROMIDE 10 MG/ML (PF) SYRINGE
PREFILLED_SYRINGE | INTRAVENOUS | Status: AC
  Filled 2021-08-06: qty 40

## 2021-08-06 MED ORDER — ACETAMINOPHEN 325 MG PO TABS
650.0000 mg | ORAL_TABLET | ORAL | Status: DC | PRN
Start: 2021-08-06 — End: 2021-08-07

## 2021-08-06 MED ORDER — ONDANSETRON HCL 4 MG/2ML IJ SOLN
INTRAMUSCULAR | Status: AC
  Filled 2021-08-06: qty 2

## 2021-08-06 MED ORDER — ACETAMINOPHEN 500 MG PO TABS
ORAL_TABLET | ORAL | Status: AC
  Administered 2021-08-06: 1000 mg via ORAL
  Filled 2021-08-06: qty 2

## 2021-08-06 MED ORDER — FENTANYL CITRATE (PF) 100 MCG/2ML IJ SOLN: 25.0000 ug | INTRAMUSCULAR | Status: DC | PRN

## 2021-08-06 MED ORDER — PROPOFOL 10 MG/ML IV BOLUS
INTRAVENOUS | Status: DC | PRN
  Administered 2021-08-06: 140 mg via INTRAVENOUS

## 2021-08-06 SURGICAL SUPPLY — 73 items
ADH SKN CLS APL DERMABOND .7 (GAUZE/BANDAGES/DRESSINGS) ×1
ADH SKN CLS LQ APL DERMABOND (GAUZE/BANDAGES/DRESSINGS) ×1
APL SKNCLS STERI-STRIP NONHPOA (GAUZE/BANDAGES/DRESSINGS)
BAG COUNTER SPONGE SURGICOUNT (BAG) ×4 IMPLANT
BAG SPNG CNTER NS LX DISP (BAG) ×3
BAND INSRT 18 STRL LF DISP RB (MISCELLANEOUS) ×2
BAND RUBBER #18 3X1/16 STRL (MISCELLANEOUS) ×4 IMPLANT
BENZOIN TINCTURE PRP APPL 2/3 (GAUZE/BANDAGES/DRESSINGS) IMPLANT
BLADE CLIPPER SURG (BLADE) IMPLANT
BLADE SURG 11 STRL SS (BLADE) ×2 IMPLANT
BLADE ULTRA TIP 2M (BLADE) IMPLANT
BUR MATCHSTICK NEURO 3.0 LAGG (BURR) ×2 IMPLANT
CANISTER SUCT 3000ML PPV (MISCELLANEOUS) ×2 IMPLANT
CARTRIDGE OIL MAESTRO DRILL (MISCELLANEOUS) ×1 IMPLANT
CATH COUDE FOLEY 2W 5CC 16FR (CATHETERS) ×1 IMPLANT
DECANTER SPIKE VIAL GLASS SM (MISCELLANEOUS) ×1 IMPLANT
DERMABOND ADHESIVE PROPEN (GAUZE/BANDAGES/DRESSINGS) ×1
DERMABOND ADVANCED (GAUZE/BANDAGES/DRESSINGS) ×1
DERMABOND ADVANCED .7 DNX12 (GAUZE/BANDAGES/DRESSINGS) ×1 IMPLANT
DERMABOND ADVANCED .7 DNX6 (GAUZE/BANDAGES/DRESSINGS) IMPLANT
DEVICE ENDSKLTN IMPL 16X14X7X6 (Cage) IMPLANT
DEVICE ENDSKLTN TC NANOLCK 6MM (Cage) IMPLANT
DIFFUSER DRILL AIR PNEUMATIC (MISCELLANEOUS) ×2 IMPLANT
DRAIN CHANNEL 10M FLAT 3/4 FLT (DRAIN) IMPLANT
DRAPE C-ARM 42X72 X-RAY (DRAPES) ×4 IMPLANT
DRAPE HALF SHEET 40X57 (DRAPES) IMPLANT
DRAPE LAPAROTOMY 100X72 PEDS (DRAPES) ×2 IMPLANT
DRAPE MICROSCOPE LEICA (MISCELLANEOUS) ×2 IMPLANT
DRSG OPSITE 4X5.5 SM (GAUZE/BANDAGES/DRESSINGS) ×4 IMPLANT
DRSG OPSITE POSTOP 3X4 (GAUZE/BANDAGES/DRESSINGS) ×4 IMPLANT
DRSG OPSITE POSTOP 4X6 (GAUZE/BANDAGES/DRESSINGS) ×1 IMPLANT
DURAPREP 6ML APPLICATOR 50/CS (WOUND CARE) ×2 IMPLANT
ELECT COATED BLADE 2.86 ST (ELECTRODE) ×2 IMPLANT
ELECT REM PT RETURN 9FT ADLT (ELECTROSURGICAL) ×2
ELECTRODE REM PT RTRN 9FT ADLT (ELECTROSURGICAL) ×1 IMPLANT
ENDOSKELETON IMPLANT 16X14X7X6 (Cage) ×6 IMPLANT
ENDOSKELETON TC NANOLOCK 6MM (Cage) ×2 IMPLANT
EVACUATOR SILICONE 100CC (DRAIN) IMPLANT
GAUZE 4X4 16PLY ~~LOC~~+RFID DBL (SPONGE) ×1 IMPLANT
GLOVE EXAM NITRILE XL STR (GLOVE) IMPLANT
GLOVE SURG ENC MOIS LTX SZ7.5 (GLOVE) IMPLANT
GLOVE SURG LTX SZ7 (GLOVE) ×2 IMPLANT
GLOVE SURG UNDER POLY LF SZ7.5 (GLOVE) ×4 IMPLANT
GOWN STRL REUS W/ TWL LRG LVL3 (GOWN DISPOSABLE) ×2 IMPLANT
GOWN STRL REUS W/ TWL XL LVL3 (GOWN DISPOSABLE) IMPLANT
GOWN STRL REUS W/TWL 2XL LVL3 (GOWN DISPOSABLE) IMPLANT
GOWN STRL REUS W/TWL LRG LVL3 (GOWN DISPOSABLE) ×4
GOWN STRL REUS W/TWL XL LVL3 (GOWN DISPOSABLE)
HEMOSTAT POWDER KIT SURGIFOAM (HEMOSTASIS) ×3 IMPLANT
KIT BASIN OR (CUSTOM PROCEDURE TRAY) ×2 IMPLANT
KIT TURNOVER KIT B (KITS) ×2 IMPLANT
NDL SPNL 22GX3.5 QUINCKE BK (NEEDLE) ×1 IMPLANT
NEEDLE HYPO 22GX1.5 SAFETY (NEEDLE) ×2 IMPLANT
NEEDLE SPNL 22GX3.5 QUINCKE BK (NEEDLE) ×2 IMPLANT
NS IRRIG 1000ML POUR BTL (IV SOLUTION) ×2 IMPLANT
OIL CARTRIDGE MAESTRO DRILL (MISCELLANEOUS) ×2
PACK LAMINECTOMY NEURO (CUSTOM PROCEDURE TRAY) ×2 IMPLANT
PAD ARMBOARD 7.5X6 YLW CONV (MISCELLANEOUS) ×6 IMPLANT
PLATE ZEVO 1LVL 17MM (Plate) ×2 IMPLANT
PLATE ZEVO 1LVL 19MM (Plate) ×2 IMPLANT
PUTTY GRAFTON DBF 6CC W/DELIVE (Putty) ×1 IMPLANT
SCREW 3.5 SELFDRILL 15MM VARI (Screw) ×16 IMPLANT
SPONGE INTESTINAL PEANUT (DISPOSABLE) ×2 IMPLANT
SPONGE SURGIFOAM ABS GEL 100 (HEMOSTASIS) ×1 IMPLANT
STRIP CLOSURE SKIN 1/2X4 (GAUZE/BANDAGES/DRESSINGS) IMPLANT
SUT ETHILON 3 0 FSL (SUTURE) IMPLANT
SUT VIC AB 3-0 SH 8-18 (SUTURE) ×2 IMPLANT
SUT VICRYL 3-0 RB1 18 ABS (SUTURE) ×4 IMPLANT
TAPE CLOTH 3X10 TAN LF (GAUZE/BANDAGES/DRESSINGS) ×2 IMPLANT
TOWEL GREEN STERILE (TOWEL DISPOSABLE) ×2 IMPLANT
TOWEL GREEN STERILE FF (TOWEL DISPOSABLE) ×2 IMPLANT
TRAY FOLEY MTR SLVR 16FR STAT (SET/KITS/TRAYS/PACK) ×1 IMPLANT
WATER STERILE IRR 1000ML POUR (IV SOLUTION) ×2 IMPLANT

## 2021-08-06 NOTE — Op Note (Signed)
?NEUROSURGERY OPERATIVE NOTE  ? ?PREOP DIAGNOSIS: Cervical Stenosis with myelopathy, C3-4, C4-5, C5-6, C6-7 ? ?POSTOP DIAGNOSIS: Same ? ?PROCEDURE: ?1. Discectomy at C3-4, C4-5, C5-6, C6-40for decompression of spinal cord and exiting nerve roots  ?2. Placement of intervertebral biomechanical device, Medtronic Titan 76mm lordotic medium width @ C3-4, 71mm lordotic medium width @ C4-5, C5-6, C6-7 ?3. Placement of anterior instrumentation consisting of interbody plate and screws Medtronic Zevo 20mm plate x2, Zevo 37TG plate x2, 62IR screws x16 ?4. Use of morselized bone allograft  ?5. Arthrodesis C3-4, C4-5, C5-6, C6-7, anterior interbody technique  ?6. Use of intraoperative microscope ? ?SURGEON: Dr. Lisbeth Renshaw, MD ? ?ASSISTANT: Dr. Coletta Memos, MD ? ?ANESTHESIA: General Endotracheal ? ?EBL: 50cc ? ?SPECIMENS: None ? ?DRAINS: None ? ?COMPLICATIONS: None immediate ? ?CONDITION: Hemodynamically stable to PACU ? ?HISTORY: ?[name] [name] is a 61 y.o. Man initially seen in the outpatient neurosurgery clinic complaining of left-sided neck and arm pain and weakness involving both the left arm and leg.  Symptoms have progressively been worsening over the last several months.  His imaging did reveal significant cervical stenosis with myelopathy at C3-4 C4-5 and C5-6 with significant left-sided foraminal stenosis at C6-7.  He therefore did elect to proceed with recommended surgical decompression and fusion.  The risks, benefits, and alternatives to surgery were all reviewed in detail with the patient.  After all his questions were answered informed consent was obtained and witnessed. ? ?PROCEDURE IN DETAIL: ?The patient was brought to the operating room and transferred to the operative table. After induction of general anesthesia, the patient was positioned on the operative table in the supine position with all pressure points meticulously padded. The skin of the neck was then prepped and draped in the usual sterile  fashion. ? ?After timeout was conducted, the skin was infiltrated with local anesthetic. Skin incision was then made sharply and Bovie electrocautery was used to dissect the subcutaneous tissue until the platysma was identified. The platysma was then divided and undermined. The sternocleidomastoid muscle was then identified and, utilizing natural fascial planes in the neck, the prevertebral fascia was identified and the carotid sheath was retracted laterally and the trachea and esophagus retracted medially. Spinal needle was then introduced and our location was confirmed to be at the C3-4 interspace.  Bovie electrocautery was then used to dissect in the subperiosteal plane and elevate the longus coli muscles bilaterally.  Dissection was then carried inferiorly to identify the C4-5, C5-6, and C6-7 interspaces.  Table mounted retractors were then placed and attention was turned initially to the C6-7 space. ? ?The C6-7 disc space was incised sharply and rongeurs were use to initially complete a discectomy. The high-speed drill was then used to complete discectomy until the posterior annulus was identified and removed and the posterior longitudinal ligament was identified. Using a nerve hook, the PLL was elevated, and Kerrison rongeurs were used to remove the posterior longitudinal ligament and the ventral thecal sac was identified. Using a combination of curettes and rongeurs, complete decompression of the thecal sac and exiting nerve roots at this level was completed, and verified using micro-nerve hook. This includes removal of the posterior half of the uncovertebral joints bilaterally in order to fully decompress the exiting nerve roots. ? ?Endplate was prepared with curettes. At this point, a 35mm lordotic interbody cage was sized and packed with morcellized bone allograft. This was then inserted and tapped into place flush with the anterior vertebral body. A 19 mm plate was then selected and  placed across the C6-7  interspace.  This was secured into C6 and C7 with 15 mm variable angle screws. ? ?Attention was then turned to the C5-6 level. In a similar fashion, discectomy was completed initially with curettes and rongeurs, and completed with the drill. The PLL was again identified, elevated and incised. Using Kerrison rongeurs, decompression of the spinal cord and exiting roots at this level was completed, again including removal of the posterior portion of the uncovertebral joint for good decompression of the exiting nerve roots. ? ?Endplate was prepared with curettes. A 33mm lordotic interbody cage was then sized and filled with bone allograft, and tapped into place. Another 19 mm plate was then placed across the C5-6 interspace and secured with 15 mm variable angle screws. ? ?Attention was then turned to the C4-5 level. Again, the discectomy was completed initially with curettes and rongeurs, and completed with a high-speed drill.  PLL was elevated and removed piecemeal with Kerrison punches.  Thecal sac was completely decompressed including removal of the uncovertebral joints for decompression of the exiting nerve roots. ? ?Endplate was prepared with curettes. A 46mm interbody cage was then sized and filled with bone allograft, and tapped into place. A 17 mm plate was then placed across the C5-6 interspace and secured with 15 mm variable angle screws. ? ?Finally, attention was turned to the C3-4 level.  Again in a similar fashion, initial discectomy was completed with rongeurs and curettes.  This was completed with a high-speed drill.  The PLL was elevated and removed piecemeal with Kerrison punches.  The uncovertebral joints were removed with a high-speed drill and Kerrison punches.  This allowed complete decompression of the thecal sac and the exiting nerve roots at this level. ? ?The endplates were then prepared with curettes.  A 6 mm medium with lordotic graft was then selected and packed with bone allograft.  This was  then tapped into place flush with the anterior vertebral body.  A 17 mm plate was then selected and placed across the interspace and secured with 15 mm variable angle screws. Final fluoroscopic images in lateral projection was taken to confirm good hardware placement. ? ?At this point, after all counts were verified to be correct, meticulous hemostasis was secured using a combination of bipolar electrocautery and passive hemostatics. The platysma muscle was then closed using interrupted 3-0 Vicryl sutures, and the skin was closed with a interrupted subcuticular stitch. Sterile dressings were then applied and the drapes removed. ? ?The patient tolerated the procedure well and was extubated in the room and taken to the postanesthesia care unit in stable condition. ? ? ?Lisbeth Renshaw, MD ?[facility]  ?

## 2021-08-06 NOTE — Anesthesia Procedure Notes (Signed)
Procedure Name: Intubation ?Date/Time: 08/06/2021 1:24 PM ?Performed by: Maude Leriche, CRNA ?Pre-anesthesia Checklist: Patient identified, Emergency Drugs available, Suction available and Patient being monitored ?Patient Re-evaluated:Patient Re-evaluated prior to induction ?Oxygen Delivery Method: Circle system utilized ?Preoxygenation: Pre-oxygenation with 100% oxygen ?Induction Type: IV induction ?Ventilation: Mask ventilation without difficulty ?Laryngoscope Size: Sabra Heck and 2 ?Grade View: Grade I ?Tube type: Oral ?Tube size: 7.0 mm ?Number of attempts: 1 ?Airway Equipment and Method: Stylet ?Placement Confirmation: ETT inserted through vocal cords under direct vision, positive ETCO2 and breath sounds checked- equal and bilateral ?Secured at: 22 cm ?Tube secured with: Tape ?Dental Injury: Teeth and Oropharynx as per pre-operative assessment  ?Comments: C-spine stabilization maintained ? ? ? ? ?
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
## 2021-08-06 NOTE — Transfer of Care (Signed)
Immediate Anesthesia Transfer of Care Note ? ?Patient: [name] [name] ? ?Procedure(s) Performed: Anterior Cervical Decompression/ Discectomy Fusion Cervical Three-Four, Cervical Four-Five, Cervical Five-Six, Cervical Six-Seven ? ?Patient Location: PACU ? ?Anesthesia Type:General ? ?Level of Consciousness: awake, alert  and oriented ? ?Airway & Oxygen Therapy: Patient Spontanous Breathing and Patient connected to nasal cannula oxygen ? ?Post-op Assessment: Report given to RN, Post -op Vital signs reviewed and stable, Patient moving all extremities X 4 and Patient able to stick tongue midline ? ?Post vital signs: Reviewed ? ?Last Vitals:  ?Vitals Value Taken Time  ?BP 139/98 08/06/21 1710  ?Temp 36.6 ?C 08/06/21 1710  ?Pulse 84 08/06/21 1713  ?Resp 17 08/06/21 1713  ?SpO2 99 % 08/06/21 1713  ?Vitals shown include unvalidated device data. ? ?Last Pain:  ?Vitals:  ? 08/06/21 1000  ?TempSrc: Oral  ?PainSc: 7   ?   ? ?  ? ?Complications: No notable events documented. ?

## 2021-08-06 NOTE — Progress Notes (Signed)
Orthopedic Tech Progress Note ?Patient Details:  ?Braulio Kiedrowski ?[birth year] ?[id number] ? ?OR RN called requesting an ASPEN CERVICAL COLLAR  ? ?Patient ID: Thurl Boen, male   DOB: 14-Sep-1960, 61 y.o.   MRN: [id number] ? ?Donald Pore ?08/06/2021, 3:35 PM ? ?

## 2021-08-06 NOTE — Anesthesia Postprocedure Evaluation (Signed)
Anesthesia Post Note ? ?Patient: [name] [name] ? ?Procedure(s) Performed: Anterior Cervical Decompression/ Discectomy Fusion Cervical Three-Four, Cervical Four-Five, Cervical Five-Six, Cervical Six-Seven ? ?  ? ?Patient location during evaluation: PACU ?Anesthesia Type: General ?Level of consciousness: sedated ?Pain management: pain level controlled ?Vital Signs Assessment: post-procedure vital signs reviewed and stable ?Respiratory status: spontaneous breathing and respiratory function stable ?Cardiovascular status: stable ?Postop Assessment: no apparent nausea or vomiting ?Anesthetic complications: no ? ? ?No notable events documented. ? ?Last Vitals:  ?Vitals:  ? 08/06/21 1740 08/06/21 1803  ?BP: (!) 146/84 (!) 154/84  ?Pulse: 77 73  ?Resp: 14 17  ?Temp:    ?SpO2: 98% 99%  ?  ?Last Pain:  ?Vitals:  ? 08/06/21 1740  ?TempSrc:   ?PainSc: 0-No pain  ? ? ?  ?  ?  ?  ?  ?  ? ?Saanvi Hakala DANIEL ? ? ? ? ?

## 2021-08-06 NOTE — Anesthesia Preprocedure Evaluation (Addendum)
Anesthesia Evaluation  ?Patient identified by MRN, date of birth, ID band ?Patient awake ? ? ? ?Reviewed: ?Allergy & Precautions, NPO status , Patient's Chart, lab work & pertinent test results ? ?History of Anesthesia Complications ?Negative for: history of anesthetic complications ? ?Airway ?Mallampati: II ? ?TM Distance: >3 FB ?Neck ROM: Full ? ? ? Dental ? ?(+) Poor Dentition, Dental Advisory Given ?  ?Pulmonary ?neg pulmonary ROS, Current SmokerPatient did not abstain from smoking.,  ?  ?Pulmonary exam normal ? ? ? ? ? ? ? Cardiovascular ?hypertension, Pt. on medications ?negative cardio ROS ?Normal cardiovascular exam ? ? ?  ?Neuro/Psych ?  ? GI/Hepatic ?Neg liver ROS, GERD  ,  ?Endo/Other  ?negative endocrine ROS ? Renal/GU ?negative Renal ROS  ? ?  ?Musculoskeletal ?negative musculoskeletal ROS ?(+)  ? Abdominal ?  ?Peds ? Hematology ?negative hematology ROS ?(+)   ?Anesthesia Other Findings ? ? Reproductive/Obstetrics ? ?  ? ? ? ? ? ? ? ? ? ? ? ? ? ?  ?  ? ? ? ? ? ? ? ?Anesthesia Physical ?Anesthesia Plan ? ?ASA: 2 ? ?Anesthesia Plan: General  ? ?Post-op Pain Management: Celebrex PO (pre-op)* and Tylenol PO (pre-op)*  ? ?Induction:  ? ?PONV Risk Score and Plan: 2 and Ondansetron, Dexamethasone and Midazolam ? ?Airway Management Planned: Oral ETT ? ?Additional Equipment:  ? ?Intra-op Plan:  ? ?Post-operative Plan: Extubation in OR ? ?Informed Consent: I have reviewed the patients History and Physical, chart, labs and discussed the procedure including the risks, benefits and alternatives for the proposed anesthesia with the patient or authorized representative who has indicated his/her understanding and acceptance.  ? ? ? ?Dental advisory given ? ?Plan Discussed with: Anesthesiologist and CRNA ? ?Anesthesia Plan Comments:   ? ? ? ? ? ?Anesthesia Quick Evaluation ? ?

## 2021-08-06 NOTE — OR Nursing (Signed)
I attempted to place a foley catheter after receiving a verbal order from Dr. Kathyrn Sheriff. Attempted to place 101fr latex foley 2 times then attempted to place  30fr Coude tip foley 1 time. Dr. Kathyrn Sheriff was informed that I was unable to place a foley. Dr. Kathyrn Sheriff decided to proceed with the surgical procedure. ?

## 2021-08-07 ENCOUNTER — Other Ambulatory Visit (HOSPITAL_COMMUNITY): Payer: Self-pay

## 2021-08-07 ENCOUNTER — Encounter (HOSPITAL_COMMUNITY): Payer: Self-pay | Admitting: Neurosurgery

## 2021-08-07 MED ORDER — OXYCODONE HCL 10 MG PO TABS
10.0000 mg | ORAL_TABLET | ORAL | 0 refills | Status: AC | PRN
  Filled 2021-08-07: qty 30, 5d supply, fill #0

## 2021-08-07 MED FILL — Thrombin (Recombinant) For Soln 5000 Unit: CUTANEOUS | Qty: 5000 | Status: AC

## 2021-08-07 NOTE — Evaluation (Signed)
Occupational Therapy Evaluation ?Patient Details ?Name: [name] [name] ?MRN: [id number] ?DOB: Aug 24, 1960 ?Today's Date: 08/07/2021 ? ? ?History of Present Illness 61 yo M s/p ACDF.  PMH includes HTN.  ? ?Clinical Impression ?  ?Patient admitted for the above procedure.  He has expected post acute soreness, but is close to his baseline for ADL completion and in room mobility.  Patient lives alone, and has no one to assist him at home.  He is close to his baseline, and should progress quickly as his pain lessens.  No further OT needs in the acute setting.  All precautions reviewed, all questions answered, and no post acute OT anticipated.    ?   ? ?Recommendations for follow up therapy are one component of a multi-disciplinary discharge planning process, led by the attending physician.  Recommendations may be updated based on patient status, additional functional criteria and insurance authorization.  ? ?Follow Up Recommendations ? No OT follow up  ?  ?Assistance Recommended at Discharge None  ?Patient can return home with the following   ? ?  ?Functional Status Assessment ? Patient has not had a recent decline in their functional status  ?Equipment Recommendations ? None recommended by OT  ?  ?Recommendations for Other Services   ? ? ?  ?Precautions / Restrictions Precautions ?Precautions: Cervical ?Precaution Booklet Issued: Yes (comment) ?Required Braces or Orthoses: Cervical Brace ?Cervical Brace: Hard collar;For comfort ?Restrictions ?Weight Bearing Restrictions: No  ? ?  ? ?Mobility Bed Mobility ?Overal bed mobility: Needs Assistance ?Bed Mobility: Sidelying to Sit, Sit to Sidelying ?  ?Sidelying to sit: Supervision ?  ?  ?Sit to sidelying: Supervision ?  ?  ? ?Transfers ?Overall transfer level: Needs assistance ?Equipment used: Rolling walker (2 wheels) ?Transfers: Sit to/from Stand ?Sit to Stand: Modified independent (Device/Increase time) ?  ?  ?  ?  ?  ?  ?  ? ?  ?Balance Overall balance assessment: Mild deficits  observed, not formally tested ?  ?  ?  ?  ?  ?  ?  ?  ?  ?  ?  ?  ?  ?  ?  ?  ?  ?  ?   ? ?ADL either performed or assessed with clinical judgement  ? ?ADL Overall ADL's : Modified independent ?  ?  ?  ?  ?  ?  ?  ?  ?  ?  ?  ?  ?  ?  ?  ?  ?  ?  ?  ?   ? ? ? ?Vision Baseline Vision/History: 1 Wears glasses ?Patient Visual Report: No change from baseline ?   ?   ?Perception Perception ?Perception: Not tested ?  ?Praxis Praxis ?Praxis: Not tested ?  ? ?Pertinent Vitals/Pain Pain Assessment ?Pain Assessment: Faces ?Faces Pain Scale: Hurts even more ?Pain Location: neck ?Pain Descriptors / Indicators: Tender ?Pain Intervention(s): Monitored during session  ? ? ? ?Hand Dominance Right ?  ?Extremity/Trunk Assessment Upper Extremity Assessment ?Upper Extremity Assessment: LUE deficits/detail ?LUE Deficits / Details: Mild weakness but functional ?LUE Sensation: decreased light touch ?LUE Coordination: decreased fine motor ?  ?Lower Extremity Assessment ?Lower Extremity Assessment: Defer to PT evaluation ?  ?Cervical / Trunk Assessment ?Cervical / Trunk Assessment: Neck Surgery ?  ?Communication Communication ?Communication: No difficulties ?  ?Cognition Arousal/Alertness: Awake/alert ?Behavior During Therapy: [facility] for tasks assessed/performed ?Overall Cognitive Status: Within Functional Limits for tasks assessed ?  ?  ?  ?  ?  ?  ?  ?  ?  ?  ?  ?  ?  ?  ?  ?  ?  ?  ?  ?  General Comments   VSS on RA ? ?  ?Exercises   ?  ?Shoulder Instructions    ? ? ?Home Living Family/patient expects to be discharged to:: Private residence ?Living Arrangements: Alone ?Available Help at Discharge: Family ?Type of Home: House ?Home Access: Level entry ?  ?  ?Home Layout: One level ?  ?  ?Bathroom Shower/Tub: Tub/shower unit ?  ?Bathroom Toilet: Standard ?Bathroom Accessibility: Yes ?How Accessible: Accessible via walker ?Home Equipment: None ?  ?  ?  ? ?  ?Prior Functioning/Environment Prior Level of Function : Independent/Modified  Independent ?  ?  ?  ?  ?  ?  ?  ?  ?  ? ?  ?  ?OT Problem List: Decreased strength;Impaired balance (sitting and/or standing) ?  ?   ?OT Treatment/Interventions:    ?  ?OT Goals(Current goals can be found in the care plan section) Acute Rehab OT Goals ?Patient Stated Goal: Return home ?OT Goal Formulation: With patient ?Time For Goal Achievement: 08/12/21 ?Potential to Achieve Goals: Good  ?OT Frequency:   ?  ? ?Co-evaluation   ?  ?  ?  ?  ? ?  ?AM-PAC OT "6 Clicks" Daily Activity     ?Outcome Measure Help from another person eating meals?: None ?Help from another person taking care of personal grooming?: None ?Help from another person toileting, which includes using toliet, bedpan, or urinal?: None ?Help from another person bathing (including washing, rinsing, drying)?: None ?Help from another person to put on and taking off regular upper body clothing?: None ?Help from another person to put on and taking off regular lower body clothing?: None ?6 Click Score: 24 ?  ?End of Session Equipment Utilized During Treatment: Rolling walker (2 wheels) ?Nurse Communication: Mobility status ? ?Activity Tolerance: Patient tolerated treatment well ?Patient left: in bed;with call bell/phone within reach ? ?OT Visit Diagnosis: Unsteadiness on feet (R26.81);Muscle weakness (generalized) (M62.81)  ?              ?Time: 3790-2409 ?OT Time Calculation (min): 25 min ?Charges:  OT General Charges ?$OT Visit: 1 Visit ?OT Evaluation ?$OT Eval Moderate Complexity: 1 Mod ?OT Treatments ?$Self Care/Home Management : 8-22 mins ? ?08/07/2021 ? ?RP, OTR/L ? ?Acute Rehabilitation Services ? ?Office:  [phone] ? ? ?Karalee Hauter D Favio Moder ?08/07/2021, 8:40 AM ?

## 2021-08-07 NOTE — Addendum Note (Signed)
Addendum  created 08/07/21 0955 by Adair Laundry, CRNA  ? Intraprocedure Meds edited  ?  ?

## 2021-08-07 NOTE — Evaluation (Signed)
Physical Therapy Evaluation and Discharge ?Patient Details ?Name: [name] [name] ?MRN: [id number] ?DOB: [birth year] ?Today's Date: 08/07/2021 ? ?History of Present Illness ? Pt is a 61 yo M s/p ACDF.  PMH includes HTN.  ?Clinical Impression ? Patient evaluated by Physical Therapy with no further acute PT needs identified. All education has been completed and the patient has no further questions. Pt was able to demonstrate transfers and ambulation with gross modified independence to supervision for safety. Pt was educated on precautions, brace application/wearing schedule, appropriate activity progression, and car transfer. See below for any follow-up Physical Therapy or equipment needs. PT is signing off. Thank you for this referral.    ?   ? ?Recommendations for follow up therapy are one component of a multi-disciplinary discharge planning process, led by the attending physician.  Recommendations may be updated based on patient status, additional functional criteria and insurance authorization. ? ?Follow Up Recommendations No PT follow up ? ?  ?Assistance Recommended at Discharge PRN  ?Patient can return home with the following ? A little help with walking and/or transfers;Assistance with cooking/housework;Assist for transportation ? ?  ?Equipment Recommendations Rolling walker (2 wheels)  ?Recommendations for Other Services ?    ?  ?Functional Status Assessment Patient has had a recent decline in their functional status and demonstrates the ability to make significant improvements in function in a reasonable and predictable amount of time.  ? ?  ?Precautions / Restrictions Precautions ?Precautions: Cervical ?Precaution Booklet Issued: Yes (comment) ?Precaution Comments: Reviewed handout and pt was cued for precautions during functional mobility. ?Required Braces or Orthoses: Cervical Brace ?Cervical Brace: Hard collar;For comfort ?Restrictions ?Weight Bearing Restrictions: No  ? ?  ? ?Mobility ? Bed Mobility ?  ?  ?  ?  ?  ?   ?  ?General bed mobility comments: Pt was received sitting up in the recliner. ?  ? ?Transfers ?Overall transfer level: Modified independent ?Equipment used: Rolling walker (2 wheels) ?Transfers: Sit to/from Stand ?  ?  ?  ?  ?  ?  ?General transfer comment: No assist required. Pt was able to power up to full stand with increased time and effort. ?  ? ?Ambulation/Gait ?Ambulation/Gait assistance: Supervision ?Gait Distance (Feet): 300 Feet ?Assistive device: Rolling walker (2 wheels) ?Gait Pattern/deviations: Step-through pattern, Decreased stride length, Trunk flexed, Decreased weight shift to left, Decreased dorsiflexion - left, Decreased stance time - left ?Gait velocity: Decreased ?Gait velocity interpretation: 1.31 - 2.62 ft/sec, indicative of limited community ambulator ?  ?General Gait Details: VC's for improved posture, closer walker proximity, and forward gaze. No assist required. ? ?Stairs ?  ?  ?  ?  ?  ? ?Wheelchair Mobility ?  ? ?Modified Rankin (Stroke Patients Only) ?  ? ?  ? ?Balance Overall balance assessment: Mild deficits observed, not formally tested ?  ?  ?  ?  ?  ?  ?  ?  ?  ?  ?  ?  ?  ?  ?  ?  ?  ?  ?   ? ? ? ?Pertinent Vitals/Pain Pain Assessment ?Pain Assessment: Faces ?Faces Pain Scale: Hurts little more ?Pain Location: neck ?Pain Descriptors / Indicators: Tender, Operative site guarding ?Pain Intervention(s): Limited activity within patient's tolerance, Monitored during session, Repositioned  ? ? ?Home Living Family/patient expects to be discharged to:: Private residence ?Living Arrangements: Alone ?Available Help at Discharge: Family ?Type of Home: House ?Home Access: Level entry ?  ?  ?  ?Home Layout: One level ?  Home Equipment: None ?   ?  ?Prior Function Prior Level of Function : Independent/Modified Independent ?  ?  ?  ?  ?  ?  ?  ?  ?  ? ? ?Hand Dominance  ? Dominant Hand: Right ? ?  ?Extremity/Trunk Assessment  ? Upper Extremity Assessment ?Upper Extremity Assessment: LUE  deficits/detail ?LUE Deficits / Details: Mild weakness but functional ?LUE Sensation: decreased light touch ?LUE Coordination: decreased fine motor ?  ? ?Lower Extremity Assessment ?Lower Extremity Assessment: LLE deficits/detail ?LLE Deficits / Details: Decreased strength, coordination and sensation. Pt reports sensation improved since surgery but strength seems the same. ?  ? ?Cervical / Trunk Assessment ?Cervical / Trunk Assessment: Neck Surgery  ?Communication  ? Communication: No difficulties  ?Cognition Arousal/Alertness: Awake/alert ?Behavior During Therapy: [facility] for tasks assessed/performed ?Overall Cognitive Status: Within Functional Limits for tasks assessed ?  ?  ?  ?  ?  ?  ?  ?  ?  ?  ?  ?  ?  ?  ?  ?  ?  ?  ?  ? ?  ?General Comments   ? ?  ?Exercises    ? ?Assessment/Plan  ?  ?PT Assessment Patient needs continued PT services  ?PT Problem List Decreased strength;Decreased activity tolerance;Decreased balance;Decreased mobility;Decreased coordination;Decreased knowledge of use of DME;Decreased safety awareness;Decreased knowledge of precautions;Pain;Impaired sensation ? ?   ?  ?PT Treatment Interventions DME instruction;Gait training;Functional mobility training;Therapeutic activities;Therapeutic exercise;Balance training;Patient/family education   ? ?PT Goals (Current goals can be found in the Care Plan section)  ?Acute Rehab PT Goals ?Patient Stated Goal: Be able to care for himself at home ?PT Goal Formulation: All assessment and education complete, DC therapy ? ?  ?Frequency Min 5X/week ?  ? ? ?Co-evaluation   ?  ?  ?  ?  ? ? ?  ?AM-PAC PT "6 Clicks" Mobility  ?Outcome Measure Help needed turning from your back to your side while in a flat bed without using bedrails?: None ?Help needed moving from lying on your back to sitting on the side of a flat bed without using bedrails?: None ?Help needed moving to and from a bed to a chair (including a wheelchair)?: None ?Help needed standing up from a chair  using your arms (e.g., wheelchair or bedside chair)?: None ?Help needed to walk in hospital room?: A Little ?Help needed climbing 3-5 steps with a railing? : A Little ?6 Click Score: 22 ? ?  ?End of Session Equipment Utilized During Treatment: Gait belt;Cervical collar ?Activity Tolerance: Patient tolerated treatment well ?Patient left: in bed;with call bell/phone within reach ?Nurse Communication: Mobility status ?PT Visit Diagnosis: Unsteadiness on feet (R26.81);Pain ?Pain - part of body:  (neck) ?  ? ?Time: 0935-1000 ?PT Time Calculation (min) (ACUTE ONLY): 25 min ? ? ?Charges:   PT Evaluation ?$PT Eval Low Complexity: 1 Low ?PT Treatments ?$Gait Training: 8-22 mins ?  ?   ? ? ?Conni Slipper, PT, DPT ?Acute Rehabilitation Services ?Secure Chat Preferred ?Office: [phone]  ? ?Marylynn Pearson ?08/07/2021, 2:50 PM ? ?

## 2021-08-07 NOTE — Plan of Care (Signed)
Pt given D/C instructions with verbal understanding. Rx was delivered to the patient's room by [facility] prior to D/C. Pt's incision is clean and dry with no sign of infection. Pt's IV was removed prior to D/C. Pt D/C'd home via wheelchair per MD order. Pt received RW from [employer] per MD order. Pt is stable @ D/C and has no other needs at this time. Holli Humbles, RN  ?

## 2021-08-07 NOTE — Discharge Summary (Signed)
?  Physician Discharge Summary  ?Patient ID: ?[name] [name] ?MRN: [id number] ?DOB/AGE: 1960-06-09 61 y.o. ? ?Admit date: 08/06/2021 ?Discharge date: 08/07/2021 ? ?Admission Diagnoses:  ?Cervical stenosis with myelopathy ? ?Discharge Diagnoses:  ?Same ?Principal Problem: ?  Stenosis of cervical spine with myelopathy (HCC) ? ? ?Discharged Condition: Stable ? ?Hospital Course:  ?[name] [name] is a 61 y.o. male who underwent C3-C7 ACDF. He was at baseline postop reporting improvement in left-sided arm pain. He was ambulating, tolerating diet with pain under control with oral medication and therefore requested d/c home. ? ?Treatments: Surgery - ACDF C3-C7 ? ?Discharge Exam: ?Blood pressure 125/73, pulse 72, temperature (!) 97.5 ?F (36.4 ?C), temperature source Oral, resp. rate 16, height 5' (1.524 m), weight 54.9 kg, SpO2 96 %. ?Awake, alert, oriented ?Speech fluent, appropriate ?CN grossly intact ?5/5 RUE/RLE ?Diffuse 4-4+/5 LUE/LLE ?Wound c/d/i ? ?Disposition: Discharge disposition: 01-Home or Self Care ? ? ? ? ? ? ?Discharge Instructions   ? ? Call MD for:  redness, tenderness, or signs of infection (pain, swelling, redness, odor or green/yellow discharge around incision site)   Complete by: As directed ?  ? Call MD for:  temperature >100.4   Complete by: As directed ?  ? Diet - low sodium heart healthy   Complete by: As directed ?  ? Discharge instructions   Complete by: As directed ?  ? Walk at home as much as possible, at least 4 times / day  ? Incentive spirometry RT   Complete by: As directed ?  ? Increase activity slowly   Complete by: As directed ?  ? Lifting restrictions   Complete by: As directed ?  ? No lifting > 10 lbs  ? May shower / Bathe   Complete by: As directed ?  ? 48 hours after surgery  ? May walk up steps   Complete by: As directed ?  ? Other Restrictions   Complete by: As directed ?  ? No bending/twisting at waist  ? Remove dressing in 24 hours   Complete by: As directed ?  ? ?  ? ?Allergies as of 08/07/2021   ? ?    Reactions  ? Chantix [varenicline Tartrate] Nausea And Vomiting  ? ?  ? ?  ?Medication List  ?  ? ?STOP taking these medications   ? ?predniSONE 20 MG tablet ?Commonly known as: DELTASONE ?  ? ?  ? ?TAKE these medications   ? ?amLODipine 5 MG tablet ?Commonly known as: NORVASC ?TAKE 1 TABLET (5 MG TOTAL) BY MOUTH DAILY. TO LOWER BLOOD PRESSURE ?What changed:  ?how much to take ?when to take this ?  ?hydrocortisone 2.5 % cream ?Apply topically 2 (two) times daily. ?  ?ibuprofen 200 MG tablet ?Commonly known as: ADVIL ?Take 400 mg by mouth daily as needed (pain.). ?  ?ICY HOT EX ?Apply 1 application. topically 3 (three) times daily as needed (pain.). ?  ?Oxycodone HCl 10 MG Tabs ?Take 1 tablet (10 mg total) by mouth every 4 (four) hours as needed for up to 7 days for severe pain ((score 7 to 10)). ?  ?Salonpas 3.05-10-08 % Ptch ?Generic drug: Camphor-Menthol-Methyl Sal ?Place 1 patch onto the skin daily as needed (pain.). ?  ?tiZANidine 4 MG tablet ?Commonly known as: ZANAFLEX ?TAKE 1 TABLET (4 MG TOTAL) BY MOUTH AT BEDTIME AS NEEDED FOR MUSCLE SPASMS. ?  ? ?  ? ? ? ?Signed: ?Jackelyn Hoehn ?08/07/2021, 9:03 AM ? ? ? ?

## 2021-08-08 ENCOUNTER — Telehealth: Payer: Self-pay

## 2021-08-08 ENCOUNTER — Other Ambulatory Visit: Payer: Self-pay

## 2021-08-08 NOTE — Patient Outreach (Signed)
[facility]) Care Management ? ?08/08/2021 ? ?[name] ?Sep 06, 1960 ?[id number] ? ? ?[employer] member referral for transition of care needs. Request assigned to Jon Billings, RN for follow up. ? ?[name] ?[facility]-Care Management Assistant ?[phone]  ?

## 2021-08-08 NOTE — Telephone Encounter (Signed)
Transition Care Management Follow-up Telephone Call ?Date of discharge and from where: 08/07/2021, [facility] ?How have you been since you were released from the hospital? He said he is doing a little better since his surgery. The pain on the left side of his neck, shoulder and arm has decreased.  ?Any questions or concerns? Yes- he is interested in a scooter and will discuss with Dr Delford Field at his next appointment. He is not sure if he will qualify. ?He does not have anyone to grocery shop for him. He is receiving 14 frozen meals from [facility] and was agreeable to having a referral made to [employer] for grocery delivery. He said he likes to prepare his own meals. The referral was faxed to attention of Darl Pikes Cox/[employer]. He explained that since the surgery he has difficulty swallowing at times. He said he understands to call the surgeon if the swallowing difficulty does not improve.  ? ?Items Reviewed: ?Did the pt receive and understand the discharge instructions provided? Yes  ?Medications obtained and verified? Yes - he said he has all of his medications and did not have any questions about the med regime.  ?Other? No  ?Any new allergies since your discharge? No  ?Dietary orders reviewed? Yes ?Do you have support at home? No  - lives alone ? ?Home Care and Equipment/Supplies: ?Were home health services ordered? no ?If so, what is the name of the agency? N/a  ?Has the agency set up a time to come to the patient's home? not applicable ?Were any new equipment or medical supplies ordered?  No ?What is the name of the medical supply agency? N/a ?Were you able to get the supplies/equipment? not applicable ?Do you have any questions related to the use of the equipment or supplies? No ? ?Functional Questionnaire: (I = Independent and D = Dependent) ?ADLs: Ambulating with RW. Independent with personal care, ADLs, he just needs to take him time. Wears cervical collar most of the time but said he takes it  when in bed.  ? ? ?Follow up appointments reviewed: ? ?PCP Hospital f/u appt confirmed?  He does not see Dr Delford Field until 10/07/2021.    ?Specialist Hospital f/u appt confirmed?  He said he  does not have to see the surgeon for 3 months.  ?Are transportation arrangements needed? Yes - he contacts [employer] or [employer] for rides to medical appointments  ?If their condition worsens, is the pt aware to call PCP or go to the Emergency Dept.? Yes ?Was the patient provided with contact information for the PCP's office or ED? Yes ?Was to pt encouraged to call back with questions or concerns? Yes ? ?

## 2021-08-08 NOTE — Patient Outreach (Signed)
[facility]) Care Management ? ?08/08/2021 ? ?[name] [name] ?01-Jun-1960 ?[id number] ? ? ?Telephone call to patient for Follow up.  Patient reports he is doing good he is able to walk around the house with his rolling walker.  He states that his surgical wound to his neck is clean and dry.  Reviewed signs of infection.  He does endorse some difficulty with swallowing at times.  Discussed due to the nature of his surgery that swallowing difficulty can happen but if if gets worse or does not get gradually better seeking some medical assistance.  He verbalized understanding.   ? ?Patient reports he is independent with all aspects of care.  Patient lives alone with limited help.  He uses [employer] transportation for appointments.  Discussed with patient transportation for groceries and other things.  Patient  not sure.  Discussed [facility] transportation with him and he is interested.  CM will send information in the mail as well as CM contact information.  He is agreeable.  Patient is new to [facility] this year and is active with [facility] program.   ? 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   ? ?Bary Leriche, RN, MSN ?[facility] Care Management ?Care Management Coordinator ?Direct Line [phone] ?Toll Free: [phone]  ?Fax: [phone] ? ?

## 2021-08-15 ENCOUNTER — Emergency Department (HOSPITAL_COMMUNITY)
Admission: EM | Admit: 2021-08-15 | Discharge: 2021-08-15 | Disposition: A | Discharge status: Home / Self Care | Payer: Medicare HMO | Attending: Emergency Medicine | Admitting: Emergency Medicine

## 2021-08-15 ENCOUNTER — Encounter (HOSPITAL_COMMUNITY): Payer: Self-pay | Admitting: *Deleted

## 2021-08-15 DIAGNOSIS — R1084 Generalized abdominal pain: Secondary | ICD-10-CM | POA: Diagnosis not present

## 2021-08-15 DIAGNOSIS — R109 Unspecified abdominal pain: Secondary | ICD-10-CM | POA: Insufficient documentation

## 2021-08-15 DIAGNOSIS — K5901 Slow transit constipation: Secondary | ICD-10-CM | POA: Diagnosis not present

## 2021-08-15 DIAGNOSIS — K5900 Constipation, unspecified: Secondary | ICD-10-CM | POA: Diagnosis not present

## 2021-08-15 DIAGNOSIS — Z5321 Procedure and treatment not carried out due to patient leaving prior to being seen by health care provider: Secondary | ICD-10-CM | POA: Diagnosis not present

## 2021-08-15 LAB — COMPREHENSIVE METABOLIC PANEL
ALT: 14 U/L (ref 0–44)
AST: 19 U/L (ref 15–41)
Albumin: 3.7 g/dL (ref 3.5–5.0)
Alkaline Phosphatase: 73 U/L (ref 38–126)
Anion gap: 10 (ref 5–15)
BUN: 18 mg/dL (ref 8–23)
CO2: 26 mmol/L (ref 22–32)
Calcium: 9.2 mg/dL (ref 8.9–10.3)
Chloride: 102 mmol/L (ref 98–111)
Creatinine, Ser: 0.89 mg/dL (ref 0.61–1.24)
GFR, Estimated: >60 mL/min (ref >60)
Glucose, Bld: 132 mg/dL — ABNORMAL HIGH (ref 70–99)
Potassium: 4 mmol/L (ref 3.5–5.1)
Sodium: 138 mmol/L (ref 135–145)
Total Bilirubin: 0.8 mg/dL (ref 0.3–1.2)
Total Protein: 6.9 g/dL (ref 6.5–8.1)

## 2021-08-15 LAB — CBC WITH DIFFERENTIAL/PLATELET
Abs Immature Granulocytes: 0.1 10*3/uL — ABNORMAL HIGH (ref 0.00–0.07)
Basophils Absolute: 0 10*3/uL (ref 0.0–0.1)
Basophils Relative: 0 %
Eosinophils Absolute: 0.3 10*3/uL (ref 0.0–0.5)
Eosinophils Relative: 3 %
HCT: 45 % (ref 39.0–52.0)
Hemoglobin: 14.7 g/dL (ref 13.0–17.0)
Immature Granulocytes: 1 %
Lymphocytes Relative: 16 %
Lymphs Abs: 1.6 10*3/uL (ref 0.7–4.0)
MCH: 26.8 pg (ref 26.0–34.0)
MCHC: 32.7 g/dL (ref 30.0–36.0)
MCV: 82 fL (ref 80.0–100.0)
Monocytes Absolute: 0.6 10*3/uL (ref 0.1–1.0)
Monocytes Relative: 6 %
Neutro Abs: 7.5 10*3/uL (ref 1.7–7.7)
Neutrophils Relative %: 74 %
Platelets: 233 10*3/uL (ref 150–400)
RBC: 5.49 MIL/uL (ref 4.22–5.81)
RDW: 14.1 % (ref 11.5–15.5)
WBC: 10.1 10*3/uL (ref 4.0–10.5)
nRBC: 0 % (ref 0.0–0.2)

## 2021-08-15 LAB — LIPASE, BLOOD: Lipase: 28 U/L (ref 11–51)

## 2021-08-15 NOTE — ED Notes (Signed)
Pt left. Pt stated he couldn't wait any longer. Pt was advised to stay.  ?

## 2021-08-15 NOTE — ED Provider Triage Note (Signed)
Emergency Medicine Provider Triage Evaluation Note ? ?[name] [name] , a 61 y.o. male  was evaluated in triage.  Pt complains of abd pain. ? ?Review of Systems  ?Positive: Abd pain, constipation, unable to urinate ?Negative: Fever, nausea, vomiting ? ?Physical Exam  ?BP (!) 135/96 (BP Location: Right Arm)   Pulse 100   Temp 98.2 ?F (36.8 ?C) (Oral)   Resp 16   SpO2 100%  ?Gen:   Awake, no distress   ?Resp:  Normal effort  ?MSK:   Moves extremities without difficulty  ?Other:   ? ?Medical Decision Making  ?Medically screening exam initiated at 10:45 AM.  Appropriate orders placed.  [name] [name] was informed that the remainder of the evaluation will be completed by another provider, this initial triage assessment does not replace that evaluation, and the importance of remaining in the ED until their evaluation is complete. ? ?Had neck surgery a week ago, and haven't been able to urinate or having BM since.  Report lower abdominal pain.  On opiate pain meds for neck.  ?  ?Fayrene Helper, PA-C ?08/15/21 1049 ? ?

## 2021-08-15 NOTE — ED Triage Notes (Signed)
Pt arrived by [facility] from home for abd pain and constipation. Recent neck surgery and taking pain meds since, also unable to urinate x 1 week.  ?

## 2021-08-16 ENCOUNTER — Ambulatory Visit (HOSPITAL_COMMUNITY)
Admission: EM | Admit: 2021-08-16 | Discharge: 2021-08-16 | Disposition: A | Discharge status: Home / Self Care | Payer: Medicare HMO | Attending: Internal Medicine | Admitting: Internal Medicine

## 2021-08-16 ENCOUNTER — Encounter (HOSPITAL_COMMUNITY): Payer: Self-pay | Admitting: Emergency Medicine

## 2021-08-16 ENCOUNTER — Other Ambulatory Visit: Payer: Self-pay | Admitting: Internal Medicine

## 2021-08-16 ENCOUNTER — Ambulatory Visit (INDEPENDENT_AMBULATORY_CARE_PROVIDER_SITE_OTHER): Payer: Medicare HMO

## 2021-08-16 DIAGNOSIS — K5901 Slow transit constipation: Secondary | ICD-10-CM | POA: Diagnosis not present

## 2021-08-16 DIAGNOSIS — K5900 Constipation, unspecified: Secondary | ICD-10-CM | POA: Diagnosis not present

## 2021-08-16 DIAGNOSIS — I10 Essential (primary) hypertension: Secondary | ICD-10-CM

## 2021-08-16 MED ORDER — POLYETHYLENE GLYCOL 3350 17 G PO PACK: 17.0000 g | PACK | Freq: Every day | ORAL | 0 refills | Status: DC

## 2021-08-16 MED ORDER — SENNOSIDES-DOCUSATE SODIUM 8.6-50 MG PO TABS: 1.0000 | ORAL_TABLET | Freq: Every day | ORAL | 0 refills | Status: DC

## 2021-08-16 MED ORDER — BISACODYL 10 MG RE SUPP: 10.0000 mg | RECTAL | 0 refills | Status: DC | PRN

## 2021-08-16 NOTE — ED Triage Notes (Signed)
Pt reports that had surgery on 4/4. Reports the oxycodone has stopped him from urinating or having BM. Reports small amounts of urine will come out.  ?Went to ED yesterday but waited all day and wasn't seen.   ?Reports has pain in abd when stands up but cant go to bathroom  ?

## 2021-08-16 NOTE — Discharge Instructions (Addendum)
Increase fiber and vegetable intake ?Increase oral fluid intake ?Take medications as prescribed ?Your x-ray is significant for constipation ?Return to urgent care if you have worsening abdominal pain, abdominal distention, persistent vomiting or nausea. ?
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
## 2021-08-28 DIAGNOSIS — M4802 Spinal stenosis, cervical region: Secondary | ICD-10-CM | POA: Diagnosis not present

## 2021-08-28 DIAGNOSIS — G992 Myelopathy in diseases classified elsewhere: Secondary | ICD-10-CM | POA: Diagnosis not present

## 2021-08-28 DIAGNOSIS — R6889 Other general symptoms and signs: Secondary | ICD-10-CM | POA: Diagnosis not present
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
## 2021-10-07 ENCOUNTER — Encounter: Payer: Self-pay | Admitting: Critical Care Medicine

## 2021-10-07 ENCOUNTER — Ambulatory Visit: Payer: Medicare HMO | Attending: Critical Care Medicine | Admitting: Critical Care Medicine

## 2021-10-07 ENCOUNTER — Other Ambulatory Visit: Payer: Self-pay

## 2021-10-07 VITALS — BP 136/81 | HR 69 | Wt 117.8 lb

## 2021-10-07 DIAGNOSIS — K029 Dental caries, unspecified: Secondary | ICD-10-CM

## 2021-10-07 DIAGNOSIS — F17200 Nicotine dependence, unspecified, uncomplicated: Secondary | ICD-10-CM

## 2021-10-07 DIAGNOSIS — M50023 Cervical disc disorder at C6-C7 level with myelopathy: Secondary | ICD-10-CM

## 2021-10-07 DIAGNOSIS — I10 Essential (primary) hypertension: Secondary | ICD-10-CM

## 2021-10-07 DIAGNOSIS — Z1211 Encounter for screening for malignant neoplasm of colon: Secondary | ICD-10-CM | POA: Diagnosis not present

## 2021-10-07 MED ORDER — SENNOSIDES-DOCUSATE SODIUM 8.6-50 MG PO TABS
2.0000 | ORAL_TABLET | Freq: Every day | ORAL | 3 refills | Status: DC
  Filled 2021-10-07: qty 60, 30d supply, fill #0

## 2021-10-07 MED ORDER — AMLODIPINE BESYLATE 5 MG PO TABS: 5.0000 mg | ORAL_TABLET | Freq: Every day | ORAL | 2 refills | Status: DC

## 2021-10-07 MED ORDER — POLYETHYLENE GLYCOL 3350 17 G PO PACK
17.0000 g | PACK | Freq: Every day | ORAL | 0 refills | Status: DC
  Filled 2021-10-07: qty 30, 30d supply, fill #0

## 2021-10-07 MED ORDER — TRAMADOL HCL 50 MG PO TABS
50.0000 mg | ORAL_TABLET | Freq: Three times a day (TID) | ORAL | 0 refills | Status: AC | PRN
  Filled 2021-10-07: qty 30, 10d supply, fill #0

## 2021-10-07 NOTE — Assessment & Plan Note (Signed)
Myelopathy improved status post cervical spine surgery follow-up per neurosurgery  Plan tramadol as needed for pain

## 2021-10-07 NOTE — Patient Instructions (Signed)
Cologuard kit will be mailed to the home for cancer screening of the colon  We discussed that you are going to reduce your tobacco intake  Tramadol was sent to our pharmacy downstairs for pain  Refills on your constipation medications also sent  Keep your follow-up appointments with your neurosurgeon this week  Referral to Darel Hong of dentistry was made  Return to Dr. Joya Gaskins 3 months

## 2021-10-07 NOTE — Assessment & Plan Note (Signed)
Blood pressure controlled no changes 

## 2021-10-07 NOTE — Assessment & Plan Note (Signed)
  .   Current smoking consumption amount: 1 pack a day  . Dicsussion on advise to quit smoking and smoking impacts: Cardiovascular impacts impacts on nervous system  . Patient's willingness to quit: Not ready to quit  . Methods to quit smoking discussed: Behavioral modification  . Medication management of smoking session drugs discussed: None  . Resources provided:  AVS   . Setting quit date not established  . Follow-up arranged 2 months   Time spent counseling the patient: 5 minutes

## 2021-10-08 ENCOUNTER — Other Ambulatory Visit: Payer: Self-pay

## 2021-10-11 DIAGNOSIS — Z1211 Encounter for screening for malignant neoplasm of colon: Secondary | ICD-10-CM | POA: Diagnosis not present

## 2021-10-18 LAB — COLOGUARD: COLOGUARD: NEGATIVE

## 2021-10-19 NOTE — Progress Notes (Signed)
Let mr Alcon know cologuarg NEG  recheck in three years

## 2021-10-21 ENCOUNTER — Telehealth: Payer: Self-pay

## 2021-10-21 NOTE — Telephone Encounter (Signed)
-----   Message from Storm Frisk, MD sent at 10/19/2021  8:44 AM EDT ----- Let mr Jacquot know cologuarg NEG  recheck in three years

## 2021-10-21 NOTE — Telephone Encounter (Signed)
Pt was called and is aware of results, DOB was confirmed.  ?

## 2021-10-28 DIAGNOSIS — R69 Illness, unspecified: Secondary | ICD-10-CM | POA: Diagnosis not present
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
## 2022-01-07 ENCOUNTER — Ambulatory Visit: Payer: Medicare HMO | Attending: Critical Care Medicine | Admitting: Critical Care Medicine

## 2022-01-07 ENCOUNTER — Encounter: Payer: Self-pay | Admitting: Critical Care Medicine

## 2022-01-07 ENCOUNTER — Telehealth: Payer: Self-pay | Admitting: Critical Care Medicine

## 2022-01-07 VITALS — BP 128/73 | Ht 60.0 in | Wt 122.8 lb

## 2022-01-07 DIAGNOSIS — F17210 Nicotine dependence, cigarettes, uncomplicated: Secondary | ICD-10-CM

## 2022-01-07 DIAGNOSIS — M50023 Cervical disc disorder at C6-C7 level with myelopathy: Secondary | ICD-10-CM

## 2022-01-07 DIAGNOSIS — Z23 Encounter for immunization: Secondary | ICD-10-CM

## 2022-01-07 DIAGNOSIS — F17200 Nicotine dependence, unspecified, uncomplicated: Secondary | ICD-10-CM | POA: Diagnosis not present

## 2022-01-07 DIAGNOSIS — I10 Essential (primary) hypertension: Secondary | ICD-10-CM | POA: Diagnosis not present

## 2022-01-07 NOTE — Assessment & Plan Note (Signed)
Blood pressure improved control continue amlodipine 10 mg daily and advised patient to focus on smoking cessation

## 2022-01-07 NOTE — Telephone Encounter (Signed)
Please see this patient at your convenience for smoking cessation counseling only he is tried multiple medications including Chantix Wellbutrin and nicotine replacement without any success

## 2022-01-07 NOTE — Assessment & Plan Note (Signed)
Myelopathy is improved however patient would benefit from physical therapy orders written

## 2022-01-07 NOTE — Assessment & Plan Note (Signed)
  .   Current smoking consumption amount: 1 pack a day  Dicsussion on advise to quit smoking and smoking impacts: Cardiovascular impacts impacts on nervous system  Patient's willingness to quit: Not ready to quit  Methods to quit smoking discussed: Behavioral modification  Medication management of smoking session drugs discussed: None he has not tolerated any of these other medicines and have not been successful either  Resources provided:  AVS, will refer to licensed clinical social work for behavioral modification  Setting quit date not established  Follow-up arranged 2 months   Time spent counseling the patient: 5 minutes   

## 2022-01-07 NOTE — Patient Instructions (Signed)
Flu vaccine was given  Pneumonia vaccine was given  Stop smoking use behavioral modification our clinical social worker will call to give you counseling on this matter  Stay on amlodipine 1 daily  Physical therapy referral was sent they will call you for an appointment  Return to Dr. Delford Field 4 months

## 2022-01-14 NOTE — Telephone Encounter (Signed)
LCSWA called patient today to introduce herself and to assess patients' mental health needs. Patient was referred by PCP for "smoking cessation counseling only he is tried multiple medications including Chantix Wellbutrin and nicotine replacement without any success". Patient refused services he stated he has it under control and he is not interested in services at this time.
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
## 2022-05-13 ENCOUNTER — Ambulatory Visit: Payer: Medicare Other | Attending: Critical Care Medicine | Admitting: Critical Care Medicine

## 2022-05-13 ENCOUNTER — Encounter: Payer: Self-pay | Admitting: Critical Care Medicine

## 2022-05-13 VITALS — BP 125/79 | HR 79 | Ht 64.0 in | Wt 124.0 lb

## 2022-05-13 DIAGNOSIS — E559 Vitamin D deficiency, unspecified: Secondary | ICD-10-CM | POA: Diagnosis not present

## 2022-05-13 DIAGNOSIS — Z122 Encounter for screening for malignant neoplasm of respiratory organs: Secondary | ICD-10-CM | POA: Diagnosis not present

## 2022-05-13 DIAGNOSIS — M79605 Pain in left leg: Secondary | ICD-10-CM | POA: Insufficient documentation

## 2022-05-13 DIAGNOSIS — F17210 Nicotine dependence, cigarettes, uncomplicated: Secondary | ICD-10-CM | POA: Diagnosis not present

## 2022-05-13 DIAGNOSIS — F17200 Nicotine dependence, unspecified, uncomplicated: Secondary | ICD-10-CM

## 2022-05-13 DIAGNOSIS — M79602 Pain in left arm: Secondary | ICD-10-CM | POA: Diagnosis not present

## 2022-05-13 DIAGNOSIS — I10 Essential (primary) hypertension: Secondary | ICD-10-CM

## 2022-05-13 DIAGNOSIS — Z139 Encounter for screening, unspecified: Secondary | ICD-10-CM | POA: Diagnosis not present

## 2022-05-13 DIAGNOSIS — R531 Weakness: Secondary | ICD-10-CM | POA: Insufficient documentation

## 2022-05-13 DIAGNOSIS — M50023 Cervical disc disorder at C6-C7 level with myelopathy: Secondary | ICD-10-CM | POA: Diagnosis not present

## 2022-05-13 DIAGNOSIS — K219 Gastro-esophageal reflux disease without esophagitis: Secondary | ICD-10-CM | POA: Diagnosis not present

## 2022-05-13 MED ORDER — AMLODIPINE BESYLATE 5 MG PO TABS: 5.0000 mg | ORAL_TABLET | Freq: Every day | ORAL | 2 refills | Status: DC

## 2022-05-13 MED ORDER — PANTOPRAZOLE SODIUM 40 MG PO TBEC: 40.0000 mg | DELAYED_RELEASE_TABLET | Freq: Every day | ORAL | 3 refills | Status: DC

## 2022-05-13 NOTE — Patient Instructions (Signed)
Stay on amlodipine 1 pill daily and pantoprazole 1 daily for both blood pressure and stomach   At [facility] pick up a foot cream moisturizer that works on calluses on the feet and also a general skin moisturizer this is in the skin and foot section of the [facility]  Labs today include cholesterol panel and vitamin D level  Work on reducing tobacco intake as we discussed see above   Another physical therapy referral be made  Lung cancer screening will be obtained with a low-dose lung cancer screening study that is a CT scan this is paid for by Medicare  Return to Dr. Joya Gaskins 5 months

## 2022-05-13 NOTE — Assessment & Plan Note (Signed)
Need to reassess vitamin D level

## 2022-05-13 NOTE — Assessment & Plan Note (Signed)
Reflux disease stable continue with pantoprazole refill given

## 2022-05-13 NOTE — Assessment & Plan Note (Signed)
Still having myelopathy but is improved from prior values after cervical spine surgery

## 2022-05-13 NOTE — Assessment & Plan Note (Signed)
Needs lung cancer screening as he is a smoker greater than 20-pack-year history plan to obtain low-dose CT scan

## 2022-05-13 NOTE — Assessment & Plan Note (Signed)
Hypertension currently controlled plan to continue amlodipine 5 mg daily

## 2022-05-13 NOTE — Assessment & Plan Note (Signed)
    Current smoking consumption amount: 1 pack a day  Dicsussion on advise to quit smoking and smoking impacts: Cardiovascular impacts impacts on nervous system  Patient's willingness to quit: Not ready to quit  Methods to quit smoking discussed: Behavioral modification  Medication management of smoking session drugs discussed: None he has not tolerated any of these other medicines and have not been successful either  Resources provided:  AVS, will refer to licensed clinical social work for behavioral modification  Setting quit date not established  Follow-up arranged 2 months   Time spent counseling the patient: 5 minutes

## 2022-05-14 ENCOUNTER — Other Ambulatory Visit: Payer: Self-pay | Admitting: Critical Care Medicine

## 2022-05-14 LAB — LIPID PANEL
Chol/HDL Ratio: 2.8 ratio (ref 0.0–5.0)
Cholesterol, Total: 154 mg/dL (ref 100–199)
HDL: 56 mg/dL (ref >39)
LDL Chol Calc (NIH): 77 mg/dL (ref 0–99)
Triglycerides: 121 mg/dL (ref 0–149)
VLDL Cholesterol Cal: 21 mg/dL (ref 5–40)

## 2022-05-14 LAB — VITAMIN D 25 HYDROXY (VIT D DEFICIENCY, FRACTURES): Vit D, 25-Hydroxy: 10.9 ng/mL — ABNORMAL LOW (ref 30.0–100.0)

## 2022-05-14 MED ORDER — VITAMIN D (ERGOCALCIFEROL) 1.25 MG (50000 UNIT) PO CAPS: 50000.0000 [IU] | ORAL_CAPSULE | ORAL | 5 refills | Status: DC

## 2022-05-14 NOTE — Progress Notes (Signed)
Labs normal  except very low vitamin D.   I sent Rx for vitamin D to his pharmacy  cholesterol is good

## 2022-05-15 ENCOUNTER — Telehealth: Payer: Self-pay

## 2022-05-15 NOTE — Telephone Encounter (Signed)
Pt was called and is aware of results, DOB was confirmed.  ?

## 2022-05-15 NOTE — Telephone Encounter (Signed)
-----   Message from Elsie Stain, MD sent at 05/14/2022  8:54 AM EST ----- Labs normal  except very low vitamin D.   I sent Rx for vitamin D to his pharmacy  cholesterol is good

## 2022-06-12 ENCOUNTER — Ambulatory Visit
Admission: RE | Admit: 2022-06-12 | Discharge: 2022-06-12 | Disposition: A | Discharge status: Home / Self Care | Payer: 59 | Source: Ambulatory Visit | Attending: Critical Care Medicine | Admitting: Critical Care Medicine

## 2022-06-12 DIAGNOSIS — F17200 Nicotine dependence, unspecified, uncomplicated: Secondary | ICD-10-CM

## 2022-06-12 DIAGNOSIS — Z122 Encounter for screening for malignant neoplasm of respiratory organs: Secondary | ICD-10-CM

## 2022-06-12 DIAGNOSIS — F17210 Nicotine dependence, cigarettes, uncomplicated: Secondary | ICD-10-CM | POA: Diagnosis not present

## 2022-06-13 ENCOUNTER — Other Ambulatory Visit: Payer: Self-pay | Admitting: Critical Care Medicine

## 2022-06-13 ENCOUNTER — Telehealth: Payer: Self-pay

## 2022-06-13 DIAGNOSIS — I2510 Atherosclerotic heart disease of native coronary artery without angina pectoris: Secondary | ICD-10-CM

## 2022-06-13 DIAGNOSIS — E785 Hyperlipidemia, unspecified: Secondary | ICD-10-CM | POA: Insufficient documentation

## 2022-06-13 DIAGNOSIS — I700 Atherosclerosis of aorta: Secondary | ICD-10-CM | POA: Insufficient documentation

## 2022-06-13 DIAGNOSIS — E782 Mixed hyperlipidemia: Secondary | ICD-10-CM

## 2022-06-13 MED ORDER — ROSUVASTATIN CALCIUM 40 MG PO TABS: 40.0000 mg | ORAL_TABLET | Freq: Every day | ORAL | 3 refills | Status: DC

## 2022-06-13 NOTE — Telephone Encounter (Signed)
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

## 2022-06-13 NOTE — Progress Notes (Signed)
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

## 2022-06-13 NOTE — Telephone Encounter (Signed)
Pt was called and is aware of results, DOB was confirmed.  ?

## 2022-08-04 ENCOUNTER — Other Ambulatory Visit: Payer: Self-pay | Admitting: Critical Care Medicine

## 2022-08-04 DIAGNOSIS — I10 Essential (primary) hypertension: Secondary | ICD-10-CM

## 2022-08-04 NOTE — Telephone Encounter (Signed)
Medication Refill - Medication: amLODipine (NORVASC) 5 MG tablet [id number]   amLODipine (NORVASC) 5 MG tablet [id number]   rosuvastatin (CRESTOR) 40 MG tablet [id number]   Has the patient contacted their pharmacy? Yes.   (Agent: If no, request that the patient contact the pharmacy for the refill. If patient does not wish to contact the pharmacy document the reason why and proceed with request.) (Agent: If yes, when and what did the pharmacy advise?)  Preferred Pharmacy (with phone number or street name): [facility]   Has the patient been seen for an appointment in the last year OR does the patient have an upcoming appointment? Yes.    Agent: Please be advised that RX refills may take up to 3 business days. We ask that you follow-up with your pharmacy.

## 2022-08-05 MED ORDER — AMLODIPINE BESYLATE 5 MG PO TABS: 5.0000 mg | ORAL_TABLET | Freq: Every day | ORAL | 1 refills | Status: DC

## 2022-08-05 MED ORDER — ROSUVASTATIN CALCIUM 40 MG PO TABS: 40.0000 mg | ORAL_TABLET | Freq: Every day | ORAL | 2 refills | Status: DC

## 2022-08-05 NOTE — Telephone Encounter (Signed)
Patient has requested Rx sent to mail order pharmacy- remainder of Rx forwarded Requested Prescriptions  Pending Prescriptions Disp Refills   amLODipine (NORVASC) 5 MG tablet 90 tablet 2    Sig: Take 1 tablet (5 mg total) by mouth daily.     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     Cardiovascular:  Antilipid - Statins 2 Failed - 08/05/2022  8:04 AM      Failed - Lipid Panel in normal range within the last 12 months    Cholesterol, Total  Date Value Ref Range Status  05/13/2022 154 100 - 199 mg/dL Final   LDL Chol Calc (NIH)  Date Value Ref Range Status  05/13/2022 77 0 - 99 mg/dL Final   HDL  Date Value Ref Range Status  05/13/2022 56 >39 mg/dL Final   Triglycerides  Date Value Ref Range Status  05/13/2022 121 0 - 149 mg/dL Final          Passed - Cr in normal range and within 360 days    Creat  Date Value Ref Range Status  06/17/2016 0.92 0.70 - 1.33 mg/dL Final    Comment:      For patients > or = 64 years of age: The upper reference limit for Creatinine is approximately 13% higher for people identified as African-American.      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

## 2022-09-01 ENCOUNTER — Telehealth: Payer: Self-pay

## 2022-09-01 ENCOUNTER — Ambulatory Visit: Payer: Self-pay

## 2022-09-01 DIAGNOSIS — I10 Essential (primary) hypertension: Secondary | ICD-10-CM

## 2022-09-01 NOTE — Patient Instructions (Signed)
Visit Information  Thank you for taking time to visit with me today. Please don't hesitate to contact me if I can be of assistance to you.   Following are the goals we discussed today:  -Engage with pharmacist regarding medication delivery needs -Engage with RN Care Manager to address ongoing care coordination needs -Contact your primary care provider as needed  Your next appointment is by telephone on 5/17 at 12:00 pm with RN Care Manager Lawanna Kobus Little  Please call the care guide team at [phone] if you need to cancel or reschedule your appointment.   If you are experiencing a Mental Health or Behavioral Health Crisis or need someone to talk to, please call 911  Patient verbalizes understanding of instructions and care plan provided today and agrees to view in MyChart. Active MyChart status and patient understanding of how to access instructions and care plan via MyChart confirmed with patient.     Bevelyn Ngo, BSW, CDP Social Worker, Certified Dementia Practitioner [facility] Care Management  Care Coordination [phone]

## 2022-09-01 NOTE — Patient Outreach (Signed)
  Care Coordination   Initial Visit Note   09/01/2022 Name: [name] [name] MRN: [id number] DOB: 11-Jun-1960  [name] [name] is a 62 y.o. year old male who sees Storm Frisk, MD for primary care. I spoke with  [name] by phone today.  What matters to the patients health and wellness today?  Patient would like his medications to be delivered to his home    Goals Addressed             This Visit's Progress    COMPLETED: Care Coordination Activities       Care Coordination Interventions: SDoH screening performed - no acute resource challenges identified at this time Determined the patient does have concerns with medication adherence due to difficulty accessing the pharmacy. Patient would like assistance with medication delivery - referral placed to pharmacy Education provided on the role of the care coordination team - patient is interested in speaking with RN Care Manager; visit scheduled Encouraged the patient to contact his primary care provider as needed         SDOH assessments and interventions completed:  Yes  SDOH Interventions Today    Flowsheet Row Most Recent Value  SDOH Interventions   Food Insecurity Interventions Intervention Not Indicated  Housing Interventions Intervention Not Indicated  Transportation Interventions Intervention Not Indicated  [Uses health plan transportation]  Utilities Interventions Intervention Not Indicated        Care Coordination Interventions:  Yes, provided   Interventions Today    Flowsheet Row Most Recent Value  Chronic Disease   Chronic disease during today's visit Hypertension (HTN)  General Interventions   General Interventions Discussed/Reviewed General Interventions Discussed, Referral to Nurse, Doctor Visits  Doctor Visits Discussed/Reviewed Doctor Visits Reviewed  Nutrition Interventions   Nutrition Discussed/Reviewed Nutrition Discussed  Pharmacy Interventions   Pharmacy Dicussed/Reviewed Referral to Pharmacist  Referral  to Pharmacist --  Robinette Haines to medications]        Follow up plan: Referral made to Pharmacy, visit scheduled with RN Care Manager    Encounter Outcome:  Pt. Visit Completed   Bevelyn Ngo, Kenard Gower, CDP Social Worker, Certified Dementia Practitioner [facility] Care Management  Care Coordination [phone]

## 2022-09-01 NOTE — Patient Outreach (Signed)
  Care Coordination   09/01/2022 Name: [name] [name] MRN: [id number] DOB: 1960/08/28   Care Coordination Outreach Attempts:  An unsuccessful telephone outreach was attempted today to offer the patient information about available care coordination services as a benefit of their health plan.   Follow Up Plan:  Additional outreach attempts will be made to offer the patient care coordination information and services.   Encounter Outcome:  No Answer   Care Coordination Interventions:  No, not indicated    Bevelyn Ngo, BSW, CDP Social Worker, Certified Dementia Practitioner [facility] Care Management  Care Coordination [phone]

## 2022-09-09 ENCOUNTER — Ambulatory Visit: Payer: 59 | Attending: Critical Care Medicine | Admitting: Pharmacist

## 2022-09-09 DIAGNOSIS — I10 Essential (primary) hypertension: Secondary | ICD-10-CM

## 2022-09-09 DIAGNOSIS — Z79899 Other long term (current) drug therapy: Secondary | ICD-10-CM

## 2022-09-09 MED ORDER — PANTOPRAZOLE SODIUM 40 MG PO TBEC: 40.0000 mg | DELAYED_RELEASE_TABLET | Freq: Every day | ORAL | 1 refills | Status: DC

## 2022-09-09 MED ORDER — ROSUVASTATIN CALCIUM 40 MG PO TABS: 40.0000 mg | ORAL_TABLET | Freq: Every day | ORAL | 2 refills | Status: DC

## 2022-09-09 MED ORDER — VITAMIN D (ERGOCALCIFEROL) 1.25 MG (50000 UNIT) PO CAPS: 50000.0000 [IU] | ORAL_CAPSULE | ORAL | 2 refills | Status: DC

## 2022-09-09 MED ORDER — AMLODIPINE BESYLATE 5 MG PO TABS: 5.0000 mg | ORAL_TABLET | Freq: Every day | ORAL | 1 refills | Status: DC

## 2022-09-09 NOTE — Progress Notes (Signed)
   09/09/2022 Name: [name] [name] MRN: [id number] DOB: August 15, 1960  No chief complaint on file.   [name] [name] is a 62 y.o. year old male who presented for a telephone visit.   They were referred to the pharmacist by their Case Management Team  for assistance in managing medication access. He is requesting that his medications be filled with [facility], the mail-delivery pharmacy associated with his [employer].   Patient is participating in a Managed Medicaid Plan: no  Subjective:  Care Team: Primary Care Provider: Storm Frisk, MD ; Next Scheduled Visit: 11/11/2022   Medication Access/Adherence  Current Pharmacy:  [facility] - [place], [place] - [address] [place] [address] Phone: [phone] Fax: [phone]   Patient reports affordability concerns with their medications: No  Patient reports access/transportation concerns to their pharmacy: Yes  - has trouble arranging transportation, therefore, would like medications delivered to him.  Patient reports adherence concerns with their medications:  No     Medication Management:  Current adherence strategy: none  Patient reports Poor adherence to medications. Adherence is limited based on lack of transportation. He would like his medications delivered to him.   Patient reports the following barriers to adherence: lack of transportation  Objective:  Lab Results  Component Value Date   HGBA1C 5.3 08/18/2019    Lab Results  Component Value Date   CREATININE 0.89 08/15/2021   BUN 18 08/15/2021   NA 138 08/15/2021   K 4.0 08/15/2021   CL 102 08/15/2021   CO2 26 08/15/2021    Lab Results  Component Value Date   CHOL 154 05/13/2022   HDL 56 05/13/2022   LDLCALC 77 05/13/2022   TRIG 121 05/13/2022   CHOLHDL 2.8 05/13/2022    Medications Reviewed Today     Reviewed by Storm Frisk, MD (Physician) on 05/13/22 at 1433  Med List Status: <None>   Medication  Order Taking? Sig Documenting Provider Last Dose Status Informant  amLODipine (NORVASC) 5 MG tablet [id number] No Take 1 tablet (5 mg total) by mouth daily. Storm Frisk, MD Taking Active   bisacodyl (DULCOLAX) 10 MG suppository [id number] No Place 1 suppository (10 mg total) rectally as needed for moderate constipation. Merrilee Jansky, MD Taking Active   ibuprofen (ADVIL) 200 MG tablet [id number] No Take 400 mg by mouth daily as needed (pain.). [provider] Taking Active Self  polyethylene glycol (MIRALAX) 17 g packet [id number] No Take 17 g by mouth daily. Storm Frisk, MD Taking Active   senna-docusate (SENOKOT-S) 8.6-50 MG tablet [id number] No Take 2 tablets by mouth at bedtime. Storm Frisk, MD Taking Active               Assessment/Plan:   Medication Management: - Currently strategy insufficient to maintain appropriate adherence to prescribed medication regimen - Suggested use of weekly pill box to organize medications. I also resent his medications to [facility] pharmacy. - Created list of medication, indication, and administration time. Provided to patient    Follow Up Plan: prn PCP: in July  Butch Penny, PharmD, [place], CPP Clinical Pharmacist [facility] [place] [phone]

## 2022-09-12 ENCOUNTER — Telehealth: Payer: Self-pay | Admitting: Critical Care Medicine

## 2022-09-12 NOTE — Telephone Encounter (Signed)
Copied from CRM [id number]. Topic: Medicare AWV >> Sep 12, 2022  1:18 PM Rushie Goltz wrote: Reason for CRM: Called patient to schedule Medicare Annual Wellness Visit (AWV). Left message for patient to call back and schedule Medicare Annual Wellness Visit (AWV).  Last date of AWV: AWVI eligible as of 01/03/17  Please schedule an AWVI appointment at any time with [facility] VISIT.  If any questions, please contact me at [phone].    Thank you,  [facility] Support [facility] Direct dial  [phone]

## 2022-09-19 ENCOUNTER — Other Ambulatory Visit: Payer: Self-pay | Admitting: Pharmacist

## 2022-09-19 ENCOUNTER — Ambulatory Visit: Payer: Self-pay

## 2022-09-19 DIAGNOSIS — I10 Essential (primary) hypertension: Secondary | ICD-10-CM

## 2022-09-19 MED ORDER — ROSUVASTATIN CALCIUM 40 MG PO TABS: 40.0000 mg | ORAL_TABLET | Freq: Every day | ORAL | 2 refills | Status: DC

## 2022-09-19 MED ORDER — PANTOPRAZOLE SODIUM 40 MG PO TBEC: 40.0000 mg | DELAYED_RELEASE_TABLET | Freq: Every day | ORAL | 1 refills | Status: DC

## 2022-09-19 MED ORDER — VITAMIN D (ERGOCALCIFEROL) 1.25 MG (50000 UNIT) PO CAPS: 50000.0000 [IU] | ORAL_CAPSULE | ORAL | 2 refills | Status: DC

## 2022-09-19 MED ORDER — AMLODIPINE BESYLATE 5 MG PO TABS: 5.0000 mg | ORAL_TABLET | Freq: Every day | ORAL | 1 refills | Status: DC

## 2022-09-19 NOTE — Patient Outreach (Addendum)
  Care Coordination   Initial Visit Note   09/19/2022 Name: [name] [name] MRN: [id number] DOB: Nov 30, 1960  [name] [name] is a 62 y.o. year old male who sees [name] Frisk, MD for primary care. I spoke with  [name] [name] by phone today.  What matters to the patients health and wellness today?  Patient would like help with getting his medications refilled.     Goals Addressed             This Visit's Progress    To get help with getting refills for all medications       Care Coordination Interventions: Patient interviewed about adult health maintenance status including  Medication Management  Determined patient has not received his medication refills, he reports having no medications on hand Noted embedded Pharm D Felecia Shelling sent prescriptions to [facility] on 09/09/22 Placed joint call to [facility] with patient, determined [facility] cannot fill medications due to patient no longer has [employer] Discussed with patient [facility] and he will need to have Rx sent to this mail order pharmacy Discussed with patient that prescription refills will need [facility] in order for patient to pick up today and resume, refills can go through [facility], patient verbalizes understanding Sent secure message to [employer] with a patient status update regarding his medications and requested urgent assistance Reply received from Allie Dimmer D advising patient's scripts have been [facility], he advised patient should contact [facility] and have them contact Franky Macho in order to set up mail order pharmacy for future refills  Planned next scheduled RN CC follow up call with patient for 10/01/32 @2PM     Interventions Today    Flowsheet Row Most Recent Value  General Interventions   General Interventions Discussed/Reviewed General Interventions Discussed, General Interventions  Reviewed, Doctor Visits, Communication with  Doctor Visits Discussed/Reviewed Doctor Visits Discussed, Doctor Visits Reviewed, PCP  Communication with Pharmacists  Franky Macho Marla Roe,  [facility] pharmacy]  Education Interventions   Education Provided Provided Education  Provided Verbal Education On Medication  Pharmacy Interventions   Pharmacy Dicussed/Reviewed Pharmacy Topics Discussed, Pharmacy Topics Reviewed, Medications and their functions, Medication Adherence, Referral to Pharmacist  Medication Adherence Unable to refill medication          SDOH assessments and interventions completed:  No     Care Coordination Interventions:  Yes, provided   Follow up plan: Referral made to [facility] Pharm D to assist with medication management/refills  Follow up call scheduled for 10/02/22 @2 :00 PM    Encounter Outcome:  Pt. Visit Completed

## 2022-09-19 NOTE — Patient Instructions (Addendum)
Visit Information  Thank you for taking time to visit with me today. Please don't hesitate to contact me if I can be of assistance to you.   Following are the goals we discussed today:   Goals Addressed             This Visit's Progress    To get help with getting refills for all medications       Care Coordination Interventions: Patient interviewed about adult health maintenance status including  Medication Management  Determined patient has not received his medication refills, he reports having no medications on hand Noted embedded Pharm D Felecia Shelling sent prescriptions to [facility] on 09/09/22 Placed joint call to [facility] with patient, determined [facility] cannot fill medications due to patient no longer has [employer] Discussed with patient [facility] and he will need to have Rx sent to this mail order pharmacy Discussed with patient that prescription refills will need to [facility] today in order for patient to pick up today and resume, refills [facility] through [facility], patient verbalizes understanding Sent secure message to [employer] with a patient status update regarding his medications and requested urgent assistance Reply received from Allie Dimmer D advising patient's [facility], he advised patient should contact [facility] and have them contact Franky Macho in order to set up mail order pharmacy for future refills  Planned next scheduled RN CC follow up call with patient for 10/01/32 @2PM            Our next appointment is by telephone on 10/02/22 at 2:00 PM  Please call the care guide team at [phone] if you need to cancel or reschedule your appointment.   If you are experiencing a Mental Health or Behavioral Health Crisis or need someone to talk to, please call 1-800-273-TALK (toll free, 24 hour hotline) go to [facility] [address] [place] [address] [place] [phone])  Patient verbalizes understanding of instructions and care plan provided today and agrees to view in MyChart. Active MyChart status and patient understanding of how to access instructions and care plan via MyChart confirmed with patient.     Delsa Sale, RN, BSN, CCM Care Management Coordinator [facility] Care Management  Direct Phone: [phone]

## 2022-10-02 ENCOUNTER — Ambulatory Visit: Payer: Self-pay

## 2022-10-02 NOTE — Patient Outreach (Signed)
  Care Coordination   10/02/2022 Name: [name] [name] MRN: [id number] DOB: Sep 30, 1960   Care Coordination Outreach Attempts:  An unsuccessful telephone outreach was attempted for a scheduled appointment today.  Follow Up Plan:  Additional outreach attempts will be made to offer the patient care coordination information and services.   Encounter Outcome:  No Answer   Care Coordination Interventions:  No, not indicated    Delsa Sale, RN, BSN, CCM Care Management Coordinator [facility] Care Management Direct Phone: [phone]

## 2022-10-22 ENCOUNTER — Telehealth: Payer: Self-pay | Admitting: *Deleted

## 2022-10-22 NOTE — Progress Notes (Signed)
  Care Coordination Note  10/22/2022 Name: [name] MRN: [id number] DOB: 11/20/1960  [name] [name] is a 62 y.o. year old male who is a primary care patient of Storm Frisk, MD and is actively engaged with the care management team. I reached out to [name] by phone today to assist with re-scheduling a follow up visit with the RN Case Manager  Follow up plan: Unsuccessful telephone outreach attempt made. A HIPAA compliant phone message was left for the patient providing contact information and requesting a return call.   [facility]  Care Coordination Care Guide  Direct Dial: [phone]

## 2022-10-28 NOTE — Progress Notes (Signed)
  Care Coordination Note  10/28/2022 Name: [name] [name] MRN: [id number] DOB: 1961-05-03  [name] [name] is a 62 y.o. year old male who is a primary care patient of Storm Frisk, MD and is actively engaged with the care management team. I reached out to [name] by phone today to assist with re-scheduling a follow up visit with the RN Case Manager  Follow up plan: Telephone appointment with care management team member scheduled for:11/05/22  [facility] Coordination Care Guide  Direct Dial: [phone]

## 2022-11-05 ENCOUNTER — Ambulatory Visit: Payer: Self-pay

## 2022-11-05 NOTE — Patient Outreach (Signed)
  Care Coordination   11/05/2022 Name: [name] [name] MRN: [id number] DOB: [birth year]   Care Coordination Outreach Attempts:  An unsuccessful telephone outreach was attempted for a scheduled appointment today.  Follow Up Plan:  Additional outreach attempts will be made to offer the patient care coordination information and services.   Encounter Outcome:  No Answer   Care Coordination Interventions:  No, not indicated    Delsa Sale, RN, BSN, CCM Care Management Coordinator [facility] Care Management  Direct Phone: [phone]

## 2022-11-10 ENCOUNTER — Telehealth: Payer: Self-pay | Admitting: *Deleted

## 2022-11-10 NOTE — Progress Notes (Unsigned)
  Care Coordination Note  11/10/2022 Name: [name] [name] MRN: [id number] DOB: [birth year]  [name] [name] is a 62 y.o. year old male who is a primary care patient of Storm Frisk, MD and is actively engaged with the care management team. I reached out to [name] by phone today to assist with re-scheduling a follow up visit with the RN Case Manager  Follow up plan: Unsuccessful telephone outreach attempt made. A HIPAA compliant phone message was left for the patient providing contact information and requesting a return call.   [facility]  Care Coordination Care Guide  Direct Dial: [phone]
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
## 2022-11-11 ENCOUNTER — Ambulatory Visit: Payer: 59 | Attending: Critical Care Medicine | Admitting: Critical Care Medicine

## 2022-11-11 ENCOUNTER — Other Ambulatory Visit: Payer: Self-pay

## 2022-11-11 ENCOUNTER — Encounter: Payer: Self-pay | Admitting: Critical Care Medicine

## 2022-11-11 VITALS — BP 136/78 | HR 64 | Wt 116.6 lb

## 2022-11-11 DIAGNOSIS — R450 Nervousness: Secondary | ICD-10-CM | POA: Insufficient documentation

## 2022-11-11 DIAGNOSIS — E559 Vitamin D deficiency, unspecified: Secondary | ICD-10-CM | POA: Insufficient documentation

## 2022-11-11 DIAGNOSIS — I700 Atherosclerosis of aorta: Secondary | ICD-10-CM | POA: Diagnosis not present

## 2022-11-11 DIAGNOSIS — I10 Essential (primary) hypertension: Secondary | ICD-10-CM | POA: Diagnosis not present

## 2022-11-11 DIAGNOSIS — F17210 Nicotine dependence, cigarettes, uncomplicated: Secondary | ICD-10-CM | POA: Diagnosis not present

## 2022-11-11 DIAGNOSIS — E785 Hyperlipidemia, unspecified: Secondary | ICD-10-CM | POA: Diagnosis not present

## 2022-11-11 DIAGNOSIS — E782 Mixed hyperlipidemia: Secondary | ICD-10-CM | POA: Diagnosis not present

## 2022-11-11 DIAGNOSIS — M50023 Cervical disc disorder at C6-C7 level with myelopathy: Secondary | ICD-10-CM | POA: Diagnosis not present

## 2022-11-11 DIAGNOSIS — F17200 Nicotine dependence, unspecified, uncomplicated: Secondary | ICD-10-CM

## 2022-11-11 MED ORDER — ROSUVASTATIN CALCIUM 40 MG PO TABS
40.0000 mg | ORAL_TABLET | Freq: Every day | ORAL | 2 refills | Status: DC
  Filled 2022-11-11: qty 90, 90d supply, fill #0

## 2022-11-11 MED ORDER — PANTOPRAZOLE SODIUM 40 MG PO TBEC
40.0000 mg | DELAYED_RELEASE_TABLET | Freq: Every day | ORAL | 1 refills | Status: DC
  Filled 2022-11-11: qty 90, 90d supply, fill #0

## 2022-11-11 MED ORDER — AMLODIPINE BESYLATE 5 MG PO TABS
5.0000 mg | ORAL_TABLET | Freq: Every day | ORAL | 1 refills | Status: DC
Start: 2022-11-11 — End: 2023-01-13
  Filled 2022-11-11: qty 90, 90d supply, fill #0

## 2022-11-11 NOTE — Assessment & Plan Note (Signed)
Blood pressure actually not terribly high would like to have it less than 130/80.  Plan is to resume amlodipine at a 5 mg daily dose and send this to our pharmacy downstairs for a 1 month supply and then remaining prescription sent to [facility]

## 2022-11-11 NOTE — Assessment & Plan Note (Signed)
  .   Current smoking consumption amount: 1 pack a day  . Dicsussion on advise to quit smoking and smoking impacts: Cardiovascular impacts impacts on nervous system  . Patient's willingness to quit: Not ready to quit  . Methods to quit smoking discussed: Behavioral modification  . Medication management of smoking session drugs discussed: None he has not tolerated any of these other medicines and have not been successful either  . Resources provided:  AVS, will refer to licensed clinical social work for behavioral modification  . Setting quit date not established  . Follow-up arranged 2 months   Time spent counseling the patient: 5 minutes   

## 2022-11-11 NOTE — Patient Instructions (Addendum)
Apologies regarding the pharmacy mixup  We have sent refills on all your medications downstairs to our pharmacy  Take vitamin D2 5000 units daily over-the-counter  Resume your pantoprazole, amlodipine, and rosuvastatin.  All these were sent downstairs  No labs today  Return to Dr. Delford Field in 2 months to reassess blood pressure and obtain labs

## 2022-11-11 NOTE — Assessment & Plan Note (Signed)
Recommended to the patient to take over-the-counter vitamin D

## 2022-11-11 NOTE — Assessment & Plan Note (Signed)
Continue rosuvastatin.  

## 2022-11-11 NOTE — Assessment & Plan Note (Signed)
Patient never received physical therapy will order same

## 2022-11-12 ENCOUNTER — Other Ambulatory Visit: Payer: Self-pay

## 2022-11-12 NOTE — Progress Notes (Signed)
  Care Coordination Note  11/12/2022 Name: [name] [name] MRN: [id number] DOB: Sep 26, 1960  [name] [name] is a 62 y.o. year old male who is a primary care patient of Storm Frisk, MD and is actively engaged with the care management team. I reached out to [name] by phone today to assist with re-scheduling a follow up visit with the RN Case Manager  Follow up plan: Unsuccessful telephone outreach attempt made. A HIPAA compliant phone message was left for the patient providing contact information and requesting a return call.   [facility]  Care Coordination Care Guide  Direct Dial: [phone]
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
## 2022-11-18 ENCOUNTER — Ambulatory Visit: Payer: 59 | Attending: Critical Care Medicine

## 2022-11-18 ENCOUNTER — Other Ambulatory Visit: Payer: Self-pay

## 2022-11-18 DIAGNOSIS — R208 Other disturbances of skin sensation: Secondary | ICD-10-CM | POA: Diagnosis not present

## 2022-11-18 DIAGNOSIS — M6281 Muscle weakness (generalized): Secondary | ICD-10-CM | POA: Diagnosis not present

## 2022-11-18 DIAGNOSIS — M50023 Cervical disc disorder at C6-C7 level with myelopathy: Secondary | ICD-10-CM | POA: Insufficient documentation

## 2022-11-18 DIAGNOSIS — M542 Cervicalgia: Secondary | ICD-10-CM

## 2022-11-18 DIAGNOSIS — R262 Difficulty in walking, not elsewhere classified: Secondary | ICD-10-CM | POA: Diagnosis not present

## 2022-12-02 ENCOUNTER — Ambulatory Visit: Payer: 59

## 2022-12-04 ENCOUNTER — Ambulatory Visit: Payer: 59 | Attending: Critical Care Medicine

## 2022-12-09 ENCOUNTER — Ambulatory Visit: Payer: 59

## 2022-12-16 ENCOUNTER — Ambulatory Visit: Payer: 59 | Admitting: Physical Therapy

## 2022-12-23 ENCOUNTER — Ambulatory Visit: Payer: 59 | Attending: Critical Care Medicine

## 2022-12-23 ENCOUNTER — Ambulatory Visit: Payer: 59

## 2022-12-23 VITALS — Ht 64.0 in | Wt 116.0 lb

## 2022-12-23 DIAGNOSIS — Z599 Problem related to housing and economic circumstances, unspecified: Secondary | ICD-10-CM | POA: Diagnosis not present

## 2022-12-23 DIAGNOSIS — Z0000 Encounter for general adult medical examination without abnormal findings: Secondary | ICD-10-CM

## 2022-12-23 DIAGNOSIS — Z5900 Homelessness unspecified: Secondary | ICD-10-CM
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
## 2022-12-23 NOTE — Patient Instructions (Signed)
[name] [name] , Thank you for taking time to come for your Medicare Wellness Visit. I appreciate your ongoing commitment to your health goals. Please review the following plan we discussed and let me know if I can assist you in the future.   Referrals/Orders/Follow-Ups/Clinician Recommendations: Aim for 30 minutes of exercise or brisk walking, 6-8 glasses of water, and 5 servings of fruits and vegetables each day.  This is a list of the screening recommended for you and due dates:  Health Maintenance  Topic Date Due   Zoster (Shingles) Vaccine (1 of 2) Never done   COVID-19 Vaccine (3 - Pfizer risk series) 12/10/2019   Flu Shot  12/04/2022   Screening for Lung Cancer  06/13/2023   Medicare Annual Wellness Visit  12/23/2023   Cologuard (Stool DNA test)  10/11/2024   DTaP/Tdap/Td vaccine (2 - Td or Tdap) 10/10/2026   Hepatitis C Screening  Completed   HIV Screening  Completed   HPV Vaccine  Aged Out    Advanced directives: (ACP Link)Information on Advanced Care Planning can be found at [facility] [place] Advance Health Care Directives Advance Health Care Directives ([email or web address]   Next Medicare Annual Wellness Visit scheduled for next year: Yes

## 2022-12-24 ENCOUNTER — Telehealth: Payer: Self-pay | Admitting: *Deleted

## 2022-12-24 NOTE — Progress Notes (Signed)
  Care Coordination  Outreach Note  12/24/2022 Name: Filomeno Frilot MRN: [id number] DOB: 1960/08/21   Care Coordination Outreach Attempts: An unsuccessful telephone outreach was attempted today to offer the patient information about available care coordination services.  Follow Up Plan:  Additional outreach attempts will be made to offer the patient care coordination information and services.   Encounter Outcome:  No Answer  Gwenevere Ghazi  Care Coordination Care Guide  Direct Dial: [phone]

## 2022-12-29 NOTE — Progress Notes (Signed)
  Care Coordination   Note   12/29/2022 Name: [name] [name] MRN: [id number] DOB: 10-01-1960  [name] [name] is a 62 y.o. year old male who sees Storm Frisk, MD for primary care. I reached out to [name] by phone today to offer care coordination services.  [name] [name] was given information about Care Coordination services today including:   The Care Coordination services include support from the care team which includes your Nurse Coordinator, Clinical Social Worker, or Pharmacist.  The Care Coordination team is here to help remove barriers to the health concerns and goals most important to you. Care Coordination services are voluntary, and the patient may decline or stop services at any time by request to their care team member.   Care Coordination Consent Status: Patient agreed to services and verbal consent obtained.   Follow up plan:  Telephone appointment with care coordination team member scheduled for:  8/28  Encounter Outcome:  Pt. Scheduled  [facility] Coordination Care Guide  Direct Dial: [phone]

## 2022-12-29 NOTE — Progress Notes (Signed)
  Care Coordination  Outreach Note  12/29/2022 Name: Merrill Seldon MRN: [id number] DOB: 1960-06-13   Care Coordination Outreach Attempts: A second unsuccessful outreach was attempted today to offer the patient with information about available care coordination services.  Follow Up Plan:  Additional outreach attempts will be made to offer the patient care coordination information and services.   Encounter Outcome:  No Answer  Gwenevere Ghazi  Care Coordination Care Guide  Direct Dial: [phone]

## 2022-12-30 ENCOUNTER — Ambulatory Visit: Payer: 59

## 2022-12-31 ENCOUNTER — Ambulatory Visit: Payer: Self-pay

## 2022-12-31 NOTE — Patient Outreach (Signed)
  Care Coordination   Initial Visit Note   12/31/2022 Name: [name] [name] MRN: [id number] DOB: 04-Feb-1961  [name] [name] is a 62 y.o. year old male who sees Storm Frisk, MD for primary care. I spoke with  [name] by phone today.  What matters to the patients health and wellness today?  The patient would like to shower    Goals Addressed             This Visit's Progress    COMPLETED: Care Coordination Activities       Care Coordination Interventions: Discussed the patient has been living in his car since August 1st. Prior to August 1st patient was paying rent to a family to live in their home. He was asked to leave, patient unsure the reasoning Determined the patient has a hammock in a tree and is using a propane gas stove to cook meals. He does not have concerns with ability to afford food as her has both [facility] and [facility] health food benefit. Patient is concerned with ability to store food but notes he just buys and cooks what is needed for that day Reviewed patient has called local shelters but all are full so he is staying in his car. Patient reports he would like to find a place to shower Advised the patient to visit the [facility] in [place] in order to shower and speak with staff regarding resources for homelessness Provided the patient with the address and contact number to the [facility] by both MyChart message and email Encouraged the patient to contact SW as needed         SDOH assessments and interventions completed:  No     Care Coordination Interventions:  Yes, provided   Interventions Today    Flowsheet Row Most Recent Value  Chronic Disease   Chronic disease during today's visit Other  [Homelessness]  General Interventions   General Interventions Discussed/Reviewed General Interventions Discussed, [employer]  [Discussed the patient has been living in his car since August 1. He is looking for options for showering]  Education Interventions   Education Provided Provided  Education  Provided Verbal Education On [employer] the patient to visit the [facility] to access showers]        Follow up plan: No further intervention required.   Encounter Outcome:  Pt. Visit Completed   Bevelyn Ngo, BSW, CDP Social Worker, Certified Dementia Practitioner [facility] Care Management  Care Coordination [phone]

## 2022-12-31 NOTE — Patient Instructions (Signed)
Visit Information  Thank you for taking time to visit with me today. Please don't hesitate to contact me if I can be of assistance to you.   Following are the goals we discussed today:   - Go to the [facility] at [address] In [place]   If you are experiencing a Mental Health or Behavioral Health Crisis or need someone to talk to, please call 1-800-273-TALK (toll free, 24 hour hotline) go to [facility] [address] [place] [phone]) call 911  Patient verbalizes understanding of instructions and care plan provided today and agrees to view in MyChart. Active MyChart status and patient understanding of how to access instructions and care plan via MyChart confirmed with patient.     No further follow up required: Please contact me as needed.  Bevelyn Ngo, BSW, CDP Social Worker, Certified Dementia Practitioner [facility] Care Management  Care Coordination [phone]

## 2023-01-06 ENCOUNTER — Ambulatory Visit: Payer: 59
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
## 2023-01-13 ENCOUNTER — Ambulatory Visit: Payer: 59 | Attending: Critical Care Medicine | Admitting: Critical Care Medicine

## 2023-01-13 ENCOUNTER — Encounter: Payer: Self-pay | Admitting: Critical Care Medicine

## 2023-01-13 ENCOUNTER — Other Ambulatory Visit: Payer: Self-pay

## 2023-01-13 VITALS — BP 122/73 | HR 70 | Wt 115.4 lb

## 2023-01-13 DIAGNOSIS — Z122 Encounter for screening for malignant neoplasm of respiratory organs: Secondary | ICD-10-CM | POA: Diagnosis not present

## 2023-01-13 DIAGNOSIS — E782 Mixed hyperlipidemia: Secondary | ICD-10-CM

## 2023-01-13 DIAGNOSIS — Z5902 Unsheltered homelessness: Secondary | ICD-10-CM

## 2023-01-13 DIAGNOSIS — Z23 Encounter for immunization: Secondary | ICD-10-CM

## 2023-01-13 DIAGNOSIS — R634 Abnormal weight loss: Secondary | ICD-10-CM | POA: Diagnosis not present

## 2023-01-13 DIAGNOSIS — I10 Essential (primary) hypertension: Secondary | ICD-10-CM

## 2023-01-13 DIAGNOSIS — Z125 Encounter for screening for malignant neoplasm of prostate: Secondary | ICD-10-CM

## 2023-01-13 MED ORDER — PANTOPRAZOLE SODIUM 40 MG PO TBEC
40.0000 mg | DELAYED_RELEASE_TABLET | Freq: Every day | ORAL | 1 refills | Status: DC
  Filled 2023-01-13 – 2023-02-06 (×2): qty 90, 90d supply, fill #0
  Filled 2023-06-15: qty 90, 90d supply, fill #1

## 2023-01-13 MED ORDER — ROSUVASTATIN CALCIUM 40 MG PO TABS
40.0000 mg | ORAL_TABLET | Freq: Every day | ORAL | 2 refills | Status: DC
  Filled 2023-01-13 – 2023-02-06 (×2): qty 90, 90d supply, fill #0
  Filled 2023-06-15: qty 90, 90d supply, fill #1
  Filled 2023-11-09: qty 90, 90d supply, fill #2

## 2023-01-13 MED ORDER — AMLODIPINE BESYLATE 5 MG PO TABS
5.0000 mg | ORAL_TABLET | Freq: Every day | ORAL | 1 refills | Status: AC
Start: 2023-01-13 — End: 2024-01-13
  Filled 2023-01-13 – 2023-02-06 (×2): qty 90, 90d supply, fill #0
  Filled 2023-06-15: qty 90, 90d supply, fill #1

## 2023-01-13 NOTE — Patient Instructions (Signed)
FLu vaccine given Labs today All medications refilled Return 5 months

## 2023-01-13 NOTE — Assessment & Plan Note (Signed)
Last lung cancer screening normal will recheck in a year in February 2025

## 2023-01-13 NOTE — Assessment & Plan Note (Signed)
Hypertension currently controlled continue amlodipine low-dose

## 2023-01-13 NOTE — Assessment & Plan Note (Addendum)
Continue rosuvastatin check lab

## 2023-01-13 NOTE — Assessment & Plan Note (Signed)
Unexplained General Weight loss colon cancer and lung cancer screens were negative will check general labs and add PSA

## 2023-01-14 ENCOUNTER — Telehealth: Payer: Self-pay

## 2023-01-14 LAB — CBC WITH DIFFERENTIAL/PLATELET
Basophils Absolute: 0.1 10*3/uL (ref 0.0–0.2)
Basos: 1 %
EOS (ABSOLUTE): 0.2 10*3/uL (ref 0.0–0.4)
Eos: 2 %
Hematocrit: 44.4 % (ref 37.5–51.0)
Hemoglobin: 14.7 g/dL (ref 13.0–17.7)
Immature Grans (Abs): 0.1 10*3/uL (ref 0.0–0.1)
Immature Granulocytes: 1 %
Lymphocytes Absolute: 1.7 10*3/uL (ref 0.7–3.1)
Lymphs: 15 %
MCH: 26.1 pg — ABNORMAL LOW (ref 26.6–33.0)
MCHC: 33.1 g/dL (ref 31.5–35.7)
MCV: 79 fL (ref 79–97)
Monocytes Absolute: 0.6 10*3/uL (ref 0.1–0.9)
Monocytes: 6 %
Neutrophils Absolute: 8.8 10*3/uL — ABNORMAL HIGH (ref 1.4–7.0)
Neutrophils: 75 %
Platelets: 229 10*3/uL (ref 150–450)
RBC: 5.63 x10E6/uL (ref 4.14–5.80)
RDW: 14.4 % (ref 11.6–15.4)
WBC: 11.4 10*3/uL — ABNORMAL HIGH (ref 3.4–10.8)

## 2023-01-14 LAB — COMPREHENSIVE METABOLIC PANEL
ALT: 28 IU/L (ref 0–44)
AST: 26 IU/L (ref 0–40)
Albumin: 4.5 g/dL (ref 3.9–4.9)
Alkaline Phosphatase: 100 IU/L (ref 44–121)
BUN/Creatinine Ratio: 14 (ref 10–24)
BUN: 16 mg/dL (ref 8–27)
Bilirubin Total: 0.2 mg/dL (ref 0.0–1.2)
CO2: 24 mmol/L (ref 20–29)
Calcium: 9.5 mg/dL (ref 8.6–10.2)
Chloride: 102 mmol/L (ref 96–106)
Creatinine, Ser: 1.12 mg/dL (ref 0.76–1.27)
Globulin, Total: 1.9 g/dL (ref 1.5–4.5)
Glucose: 95 mg/dL (ref 70–99)
Potassium: 4.5 mmol/L (ref 3.5–5.2)
Sodium: 141 mmol/L (ref 134–144)
Total Protein: 6.4 g/dL (ref 6.0–8.5)
eGFR: 74 mL/min/{1.73_m2} (ref >59)

## 2023-01-14 LAB — LIPID PANEL
Chol/HDL Ratio: 2 ratio (ref 0.0–5.0)
Cholesterol, Total: 124 mg/dL (ref 100–199)
HDL: 62 mg/dL (ref >39)
LDL Chol Calc (NIH): 46 mg/dL (ref 0–99)
Triglycerides: 80 mg/dL (ref 0–149)
VLDL Cholesterol Cal: 16 mg/dL (ref 5–40)

## 2023-01-14 LAB — PSA: Prostate Specific Ag, Serum: 1.2 ng/mL (ref 0.0–4.0)

## 2023-01-14 NOTE — Progress Notes (Signed)
Let pt know all labs normal  no diabetes,  no prostate cancer

## 2023-01-14 NOTE — Telephone Encounter (Signed)
-----   Message from Shan Levans sent at 01/14/2023  8:01 AM EDT ----- Let pt know all labs normal  no diabetes,  no prostate cancer

## 2023-01-14 NOTE — Telephone Encounter (Signed)
Pt was called and is aware of results, DOB was confirmed.  ?

## 2023-01-15 ENCOUNTER — Encounter: Payer: Self-pay | Admitting: Critical Care Medicine

## 2023-01-15 DIAGNOSIS — Z5902 Unsheltered homelessness: Secondary | ICD-10-CM | POA: Insufficient documentation

## 2023-01-15 NOTE — Assessment & Plan Note (Signed)
Living in [place] making arthritis worse,  connecting to housing resources

## 2023-02-04 ENCOUNTER — Other Ambulatory Visit: Payer: Self-pay

## 2023-02-06 ENCOUNTER — Other Ambulatory Visit: Payer: Self-pay

## 2023-06-16 ENCOUNTER — Ambulatory Visit: Payer: 59 | Attending: Internal Medicine | Admitting: Internal Medicine

## 2023-06-16 ENCOUNTER — Other Ambulatory Visit: Payer: Self-pay

## 2023-06-16 VITALS — BP 116/70 | HR 68 | Wt 123.0 lb

## 2023-06-16 DIAGNOSIS — G8929 Other chronic pain: Secondary | ICD-10-CM

## 2023-06-16 DIAGNOSIS — M542 Cervicalgia: Secondary | ICD-10-CM

## 2023-06-16 DIAGNOSIS — Z122 Encounter for screening for malignant neoplasm of respiratory organs: Secondary | ICD-10-CM

## 2023-06-16 DIAGNOSIS — E782 Mixed hyperlipidemia: Secondary | ICD-10-CM

## 2023-06-16 DIAGNOSIS — F17210 Nicotine dependence, cigarettes, uncomplicated: Secondary | ICD-10-CM

## 2023-06-16 DIAGNOSIS — Z139 Encounter for screening, unspecified: Secondary | ICD-10-CM

## 2023-06-16 DIAGNOSIS — K219 Gastro-esophageal reflux disease without esophagitis: Secondary | ICD-10-CM

## 2023-06-16 DIAGNOSIS — I10 Essential (primary) hypertension: Secondary | ICD-10-CM | POA: Diagnosis not present

## 2023-06-16 DIAGNOSIS — Z5902 Unsheltered homelessness: Secondary | ICD-10-CM

## 2023-06-16 DIAGNOSIS — M545 Low back pain, unspecified: Secondary | ICD-10-CM

## 2023-06-16 DIAGNOSIS — R918 Other nonspecific abnormal finding of lung field: Secondary | ICD-10-CM

## 2023-06-16 DIAGNOSIS — Z23 Encounter for immunization: Secondary | ICD-10-CM

## 2023-06-16 DIAGNOSIS — F17200 Nicotine dependence, unspecified, uncomplicated: Secondary | ICD-10-CM

## 2023-06-16 MED ORDER — AMLODIPINE BESYLATE 5 MG PO TABS
5.0000 mg | ORAL_TABLET | Freq: Every day | ORAL | 1 refills | Status: DC
Start: 2023-06-16 — End: 2023-10-15

## 2023-06-16 MED ORDER — PANTOPRAZOLE SODIUM 40 MG PO TBEC
40.0000 mg | DELAYED_RELEASE_TABLET | Freq: Every day | ORAL | 1 refills | Status: DC
  Filled 2023-11-09: qty 90, 90d supply, fill #0
  Filled 2024-01-15: qty 90, 90d supply, fill #1

## 2023-06-16 MED ORDER — ZOSTER VAC RECOMB ADJUVANTED 50 MCG/0.5ML IM SUSR
0.5000 mL | Freq: Once | INTRAMUSCULAR | 0 refills | Status: AC
Start: 2023-06-16 — End: 2023-06-17
  Filled 2023-06-16: qty 1, 1d supply, fill #0

## 2023-06-16 MED ORDER — DICLOFENAC SODIUM 1 % EX GEL
CUTANEOUS | 1 refills | Status: AC
  Filled 2023-06-16: qty 100, 30d supply, fill #0
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
## 2023-06-17 ENCOUNTER — Telehealth: Payer: Self-pay

## 2023-06-17 ENCOUNTER — Encounter: Payer: Self-pay | Admitting: Internal Medicine

## 2023-06-17 NOTE — Telephone Encounter (Signed)
Message received from Dr Laural Benes:  patient is living in an old broken down car.  Any assistance can be given in helping him find secure housing?   I called the patient and he explained that he has been living in his car for about 6 months.  He had been living in a rental apartment/ home at one point but was unable to afford the rent any longer and has not been able to find a place to stay that he can afford when he only receives $1000/month.  He said he would like to have a studio apartment.  He told me that his insurance company has tried to help him find a place but that has not been successful.   I told him that I can refer him to Erick Blinks, RN/ [facility] if he wishes. There is no guarantee that she can help him but we can try.  He was agreeable to having Raven call him.

## 2023-06-18 ENCOUNTER — Ambulatory Visit: Payer: Self-pay | Admitting: Licensed Clinical Social Worker

## 2023-06-18 ENCOUNTER — Telehealth: Payer: Self-pay | Admitting: *Deleted

## 2023-06-18 NOTE — Congregational Nurse Program (Signed)
RN received message by PCP team to follow up with client for potential in getting case management services. RN called client to inquire on needs and how RN could assist at the [facility]. Client did not answer phone call. Voicemail left with call back information for how client can reach RN.

## 2023-06-18 NOTE — Telephone Encounter (Signed)
 RN received message to

## 2023-06-18 NOTE — Patient Outreach (Signed)
  Care Coordination   Initial Visit Note   06/18/2023 Name: [name] [name] MRN: [id number] DOB: [birth year]  [name] [name] is a 63 y.o. year old male who sees Marcine Matar, MD for primary care. I spoke with  [name] by phone today.  What matters to the patients health and wellness today?  Connecting with housing resources.    Goals Addressed             This Visit's Progress    Increase access to housing       Care Coordination Interventions: Assessed Social Determinants of Health Reviewed all upcoming appointments in Epic system Active listening / Reflection utilized  Emotional Support Provided  Housing  (Provided education on Housing Authority)  Pt denied wanting to go to homeless shelter at this time          SDOH assessments and interventions completed:  Yes  SDOH Interventions Today    Flowsheet Row Most Recent Value  SDOH Interventions   Food Insecurity Interventions Intervention Not Indicated  Housing Interventions Community Resources Provided  [Denied connection to homeless shelter - Will give information on housing authority]  Transportation Interventions Community Resources Provided  Utilities Interventions Intervention Not Indicated        Care Coordination Interventions:  Yes, provided   Interventions Today    Flowsheet Row Most Recent Value  Chronic Disease   Chronic disease during today's visit Other  [Housing Insecurity]  General Interventions   General Interventions Discussed/Reviewed [employer], General Interventions Discussed  [Pt reported that he has been homeless since August, 2024. Pt is living in a car on a friends property. CSW reviewed homeless shelters-pt stated he did not want to go to shelter. More interested in housing. CSW provided info on [facility] waitlist]        Follow up plan: Follow up call scheduled for 06/25/2023    Encounter Outcome:  Patient Visit Completed   [name], LCSW [place], [facility] Licensed Clinical Social Worker Care Coordinator [phone]

## 2023-06-18 NOTE — Patient Instructions (Signed)
Visit Information  Thank you for taking time to visit with me today. Please don't hesitate to contact me if I can be of assistance to you.   Following are the goals we discussed today:   Goals Addressed             This Visit's Progress    Increase access to housing       Care Coordination Interventions: Assessed Social Determinants of Health Reviewed all upcoming appointments in Epic system Active listening / Reflection utilized  Emotional Support Provided  Housing  (Provided education on Housing Authority)  Pt denied wanting to go to homeless shelter at this time          Our next appointment is by telephone on 2/20.  Please call the care guide team at [phone] if you need to cancel or reschedule your appointment.   If you are experiencing a Mental Health or Behavioral Health Crisis or need someone to talk to, please call the Suicide and Crisis Lifeline: 988  Patient verbalizes understanding of instructions and care plan provided today and agrees to view in MyChart. Active MyChart status and patient understanding of how to access instructions and care plan via MyChart confirmed with patient.     Telephone follow up appointment with care management team member scheduled for: 06/25/2023

## 2023-06-18 NOTE — Progress Notes (Signed)
Complex Care Management Note  Care Guide Note 06/18/2023 Name: [name] [name] MRN: [id number] DOB: 1960-11-10  [name] [name] is a 63 y.o. year old male who sees Marcine Matar, MD for primary care. I reached out to [name] by phone today to offer complex care management services.  Mr. Wadding was given information about Complex Care Management services today including:   The Complex Care Management services include support from the care team which includes your Nurse Care Manager, Clinical Social Worker, or Pharmacist.  The Complex Care Management team is here to help remove barriers to the health concerns and goals most important to you. Complex Care Management services are voluntary, and the patient may decline or stop services at any time by request to their care team member.   Complex Care Management Consent Status: Patient agreed to services and verbal consent obtained.   Follow up plan:  Telephone appointment with complex care management team member scheduled for:  2/13  Encounter Outcome:  Patient Scheduled  Gwenevere Ghazi  [facility]  [facility], [facility] Guide  Direct Dial: [phone]  Fax [phone]'

## 2023-06-25 ENCOUNTER — Ambulatory Visit: Payer: 59 | Admitting: Licensed Clinical Social Worker

## 2023-06-25 NOTE — Patient Outreach (Signed)
  Care Coordination   Follow Up Visit Note   06/25/2023 Name: [name] [name] MRN: [id number] DOB: 06/26/1960  [name] [name] is a 63 y.o. year old male who sees Marcine Matar, MD for primary care. I spoke with  [name] by phone today.  What matters to the patients health and wellness today?  Increasing access to housing options in [place].    Goals Addressed             This Visit's Progress    Increase access to housing       Care Coordination Interventions: Assessed Social Determinants of Health Reviewed all upcoming appointments in Epic system Active listening / Reflection utilized  Emotional Support Provided  Housing  (Provided education on Housing Authority)  Pt denied wanting to go to homeless shelter at this time CSW assisted with being put on senior housing waitlist through [facility] list of multiple housing options in area Update pt on [place] warming cetners during winter months.       Obtain safe and adequate housing          SDOH assessments and interventions completed:  Yes     Care Coordination Interventions:  Yes, provided   Follow up plan: Follow up call scheduled for 07/23/2023    Encounter Outcome:  Patient Visit Completed   [name], LCSW [place], [facility] Licensed Clinical Social Worker Care Coordinator [phone]

## 2023-06-25 NOTE — Patient Instructions (Signed)
  Visit Information  Thank you for taking time to visit with me today. Please don't hesitate to contact me if I can be of assistance to you.   Following are the goals we discussed today:   Goals Addressed             This Visit's Progress    Increase access to housing       Care Coordination Interventions: Assessed Social Determinants of Health Reviewed all upcoming appointments in Epic system Active listening / Reflection utilized  Emotional Support Provided  Housing  (Provided education on Housing Authority)  Pt denied wanting to go to homeless shelter at this time CSW assisted with being put on senior housing waitlist through [facility] list of multiple housing options in area Update pt on [place] warming cetners during winter months.       Obtain safe and adequate housing          Our next appointment is by telephone on 07/23/2023.  Please call the care guide team at [phone] if you need to cancel or reschedule your appointment.   If you are experiencing a Mental Health or Behavioral Health Crisis or need someone to talk to, please call the Suicide and Crisis Lifeline: 988  Patient verbalizes understanding of instructions and care plan provided today and agrees to view in MyChart. Active MyChart status and patient understanding of how to access instructions and care plan via MyChart confirmed with patient.     Telephone follow up appointment with care management team member scheduled for:07/23/2023

## 2023-07-23 ENCOUNTER — Ambulatory Visit: Payer: Self-pay | Admitting: Licensed Clinical Social Worker

## 2023-07-23 NOTE — Patient Outreach (Signed)
 Care Coordination   Follow Up Visit Note   07/23/2023 Name: [name] [name] MRN: [id number] DOB: 12-23-1960  Shondell Poulson is a 63 y.o. year old male who sees Marcine Matar, MD for primary care. I spoke with  [name] by phone today.  What matters to the patients health and wellness today?  Increasing access to appropriate housing.    Goals Addressed             This Visit's Progress    Increase access to housing       Care Coordination Interventions: Assessed Social Determinants of Health Reviewed all upcoming appointments in Epic system Active listening / Reflection utilized  Emotional Support Provided  Housing  (Provided education on Housing Authority)  Pt denied wanting to go to homeless shelter at this time CSW assisted with being put on senior housing waitlist through [facility] list of multiple housing options in area Update pt on [place] warming cetners during winter months. CSW resent patient resources for housing and reviewed options for accessing those resources, patient voiced confidence in being able to reach them CSW encouraged patient to visit [facility] for intake on Wendesday mornings so that he can have access to their services, patient agreed to consider this.          SDOH assessments and interventions completed:  Yes     Care Coordination Interventions:  Yes, provided   Interventions Today    Flowsheet Row Most Recent Value  Chronic Disease   Chronic disease during today's visit Other  [SDOH]  General Interventions   General Interventions Discussed/Reviewed Community Resources, General Interventions Discussed  Mental Health Interventions   Mental Health Discussed/Reviewed Mental Health Discussed, Mental Health Reviewed        Follow up plan: No further intervention required.   Encounter Outcome:  Patient Visit Completed   [name], LCSW [place], [facility] Licensed Clinical Social Worker Care  Coordinator [phone]

## 2023-07-23 NOTE — Patient Instructions (Signed)
 Visit Information  Thank you for taking time to visit with me today. Please don't hesitate to contact me if I can be of assistance to you.   Following are the goals we discussed today:   Goals Addressed             This Visit's Progress    Increase access to housing       Care Coordination Interventions: Assessed Social Determinants of Health Reviewed all upcoming appointments in Epic system Active listening / Reflection utilized  Emotional Support Provided  Housing  (Provided education on Housing Authority)  Pt denied wanting to go to homeless shelter at this time CSW assisted with being put on senior housing waitlist through [facility] list of multiple housing options in area Update pt on [place] warming cetners during winter months. CSW resent patient resources for housing and reviewed options for accessing those resources, patient voiced confidence in being able to reach them CSW encouraged patient to visit [facility] for intake on Wendesday mornings so that he can have access to their services, patient agreed to consider this.           Please call the care guide team at [phone] if you need to cancel or reschedule your appointment.   If you are experiencing a Mental Health or Behavioral Health Crisis or need someone to talk to, please call the Suicide and Crisis Lifeline: 988  Patient verbalizes understanding of instructions and care plan provided today and agrees to view in MyChart. Active MyChart status and patient understanding of how to access instructions and care plan via MyChart confirmed with patient.     No further follow up required: Please contact your provider if you would like to reconnect.

## 2023-09-07 ENCOUNTER — Ambulatory Visit
Admission: RE | Admit: 2023-09-07 | Discharge: 2023-09-07 | Disposition: A | Discharge status: Home / Self Care | Payer: 59 | Source: Ambulatory Visit | Attending: Internal Medicine | Admitting: Internal Medicine

## 2023-09-07 ENCOUNTER — Other Ambulatory Visit: Payer: Self-pay | Admitting: Internal Medicine

## 2023-09-07 DIAGNOSIS — Z122 Encounter for screening for malignant neoplasm of respiratory organs: Secondary | ICD-10-CM | POA: Diagnosis not present

## 2023-09-07 DIAGNOSIS — Z139 Encounter for screening, unspecified: Secondary | ICD-10-CM

## 2023-09-07 DIAGNOSIS — F17200 Nicotine dependence, unspecified, uncomplicated: Secondary | ICD-10-CM

## 2023-09-07 DIAGNOSIS — I10 Essential (primary) hypertension: Secondary | ICD-10-CM

## 2023-09-07 DIAGNOSIS — F17210 Nicotine dependence, cigarettes, uncomplicated: Secondary | ICD-10-CM | POA: Diagnosis not present

## 2023-09-07 DIAGNOSIS — G8929 Other chronic pain: Secondary | ICD-10-CM

## 2023-09-07 DIAGNOSIS — Z5902 Unsheltered homelessness: Secondary | ICD-10-CM

## 2023-09-07 DIAGNOSIS — Z23 Encounter for immunization: Secondary | ICD-10-CM

## 2023-09-07 DIAGNOSIS — R918 Other nonspecific abnormal finding of lung field: Secondary | ICD-10-CM

## 2023-09-07 DIAGNOSIS — E782 Mixed hyperlipidemia: Secondary | ICD-10-CM

## 2023-09-07 DIAGNOSIS — K219 Gastro-esophageal reflux disease without esophagitis: Secondary | ICD-10-CM

## 2023-09-23 ENCOUNTER — Other Ambulatory Visit: Payer: Self-pay

## 2023-09-23 MED ORDER — PENICILLIN V POTASSIUM 500 MG PO TABS
500.0000 mg | ORAL_TABLET | Freq: Four times a day (QID) | ORAL | 0 refills | Status: DC
  Filled 2023-09-23: qty 28, 7d supply, fill #0

## 2023-09-23 MED ORDER — IBUPROFEN 800 MG PO TABS
800.0000 mg | ORAL_TABLET | Freq: Three times a day (TID) | ORAL | 0 refills | Status: DC
  Filled 2023-09-23: qty 20, 7d supply, fill #0

## 2023-09-24 ENCOUNTER — Other Ambulatory Visit: Payer: Self-pay

## 2023-10-03 ENCOUNTER — Ambulatory Visit: Payer: Self-pay | Admitting: Internal Medicine

## 2023-10-15 ENCOUNTER — Ambulatory Visit: Payer: 59 | Attending: Internal Medicine | Admitting: Internal Medicine

## 2023-10-15 ENCOUNTER — Encounter: Payer: Self-pay | Admitting: Internal Medicine

## 2023-10-15 ENCOUNTER — Other Ambulatory Visit: Payer: Self-pay

## 2023-10-15 VITALS — BP 124/68 | HR 83 | Ht 64.0 in | Wt 118.8 lb

## 2023-10-15 DIAGNOSIS — Z5902 Unsheltered homelessness: Secondary | ICD-10-CM

## 2023-10-15 DIAGNOSIS — M5416 Radiculopathy, lumbar region: Secondary | ICD-10-CM

## 2023-10-15 DIAGNOSIS — I10 Essential (primary) hypertension: Secondary | ICD-10-CM

## 2023-10-15 DIAGNOSIS — E782 Mixed hyperlipidemia: Secondary | ICD-10-CM

## 2023-10-15 DIAGNOSIS — F17200 Nicotine dependence, unspecified, uncomplicated: Secondary | ICD-10-CM

## 2023-10-15 DIAGNOSIS — F17210 Nicotine dependence, cigarettes, uncomplicated: Secondary | ICD-10-CM

## 2023-10-15 MED ORDER — AMLODIPINE BESYLATE 5 MG PO TABS
5.0000 mg | ORAL_TABLET | Freq: Every day | ORAL | 1 refills | Status: DC
  Filled 2023-10-15: qty 90, 90d supply, fill #0
  Filled 2024-01-15: qty 90, 90d supply, fill #1

## 2023-10-15 MED ORDER — TRAMADOL HCL 50 MG PO TABS
50.0000 mg | ORAL_TABLET | Freq: Three times a day (TID) | ORAL | 0 refills | Status: AC | PRN
  Filled 2023-10-15: qty 15, 5d supply, fill #0

## 2023-10-15 MED ORDER — IBUPROFEN 800 MG PO TABS
800.0000 mg | ORAL_TABLET | Freq: Three times a day (TID) | ORAL | 0 refills | Status: DC | PRN
  Filled 2023-10-15: qty 30, 10d supply, fill #0

## 2023-10-15 NOTE — Patient Instructions (Signed)
 Avoid heavy lifting.  Avoid pushing and pulling. I have prescribed ibuprofen  800 mg for you to take 3 times a day as needed.  We have also added a medication called tramadol  for you to take 3 times a day as needed.  This is a narcotic medication.  It can cause drowsiness.  Do not take it when you have to drive or operate machinery.  We have ordered an MRI of your lower back.  I will call you to discuss results once it is done.
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
## 2023-10-26 ENCOUNTER — Ambulatory Visit
Admission: RE | Admit: 2023-10-26 | Discharge: 2023-10-26 | Disposition: A | Discharge status: Home / Self Care | Source: Ambulatory Visit | Attending: Internal Medicine | Admitting: Internal Medicine

## 2023-10-26 DIAGNOSIS — M48061 Spinal stenosis, lumbar region without neurogenic claudication: Secondary | ICD-10-CM | POA: Diagnosis not present

## 2023-10-26 DIAGNOSIS — M4726 Other spondylosis with radiculopathy, lumbar region: Secondary | ICD-10-CM | POA: Diagnosis not present

## 2023-10-26 DIAGNOSIS — M5126 Other intervertebral disc displacement, lumbar region: Secondary | ICD-10-CM | POA: Diagnosis not present

## 2023-10-26 DIAGNOSIS — M5416 Radiculopathy, lumbar region: Secondary | ICD-10-CM

## 2023-11-06 ENCOUNTER — Ambulatory Visit: Payer: Self-pay | Admitting: Internal Medicine

## 2023-11-06 DIAGNOSIS — M5416 Radiculopathy, lumbar region: Secondary | ICD-10-CM

## 2023-11-09 ENCOUNTER — Other Ambulatory Visit: Payer: Self-pay

## 2023-11-16 IMAGING — DX DG ABDOMEN 1V
1 series · 1 of 1 positions shown · non-contrast
Comparison: None.

CLINICAL DATA: Constipation

EXAM:
ABDOMEN - 1 VIEW

[abdomen kub]
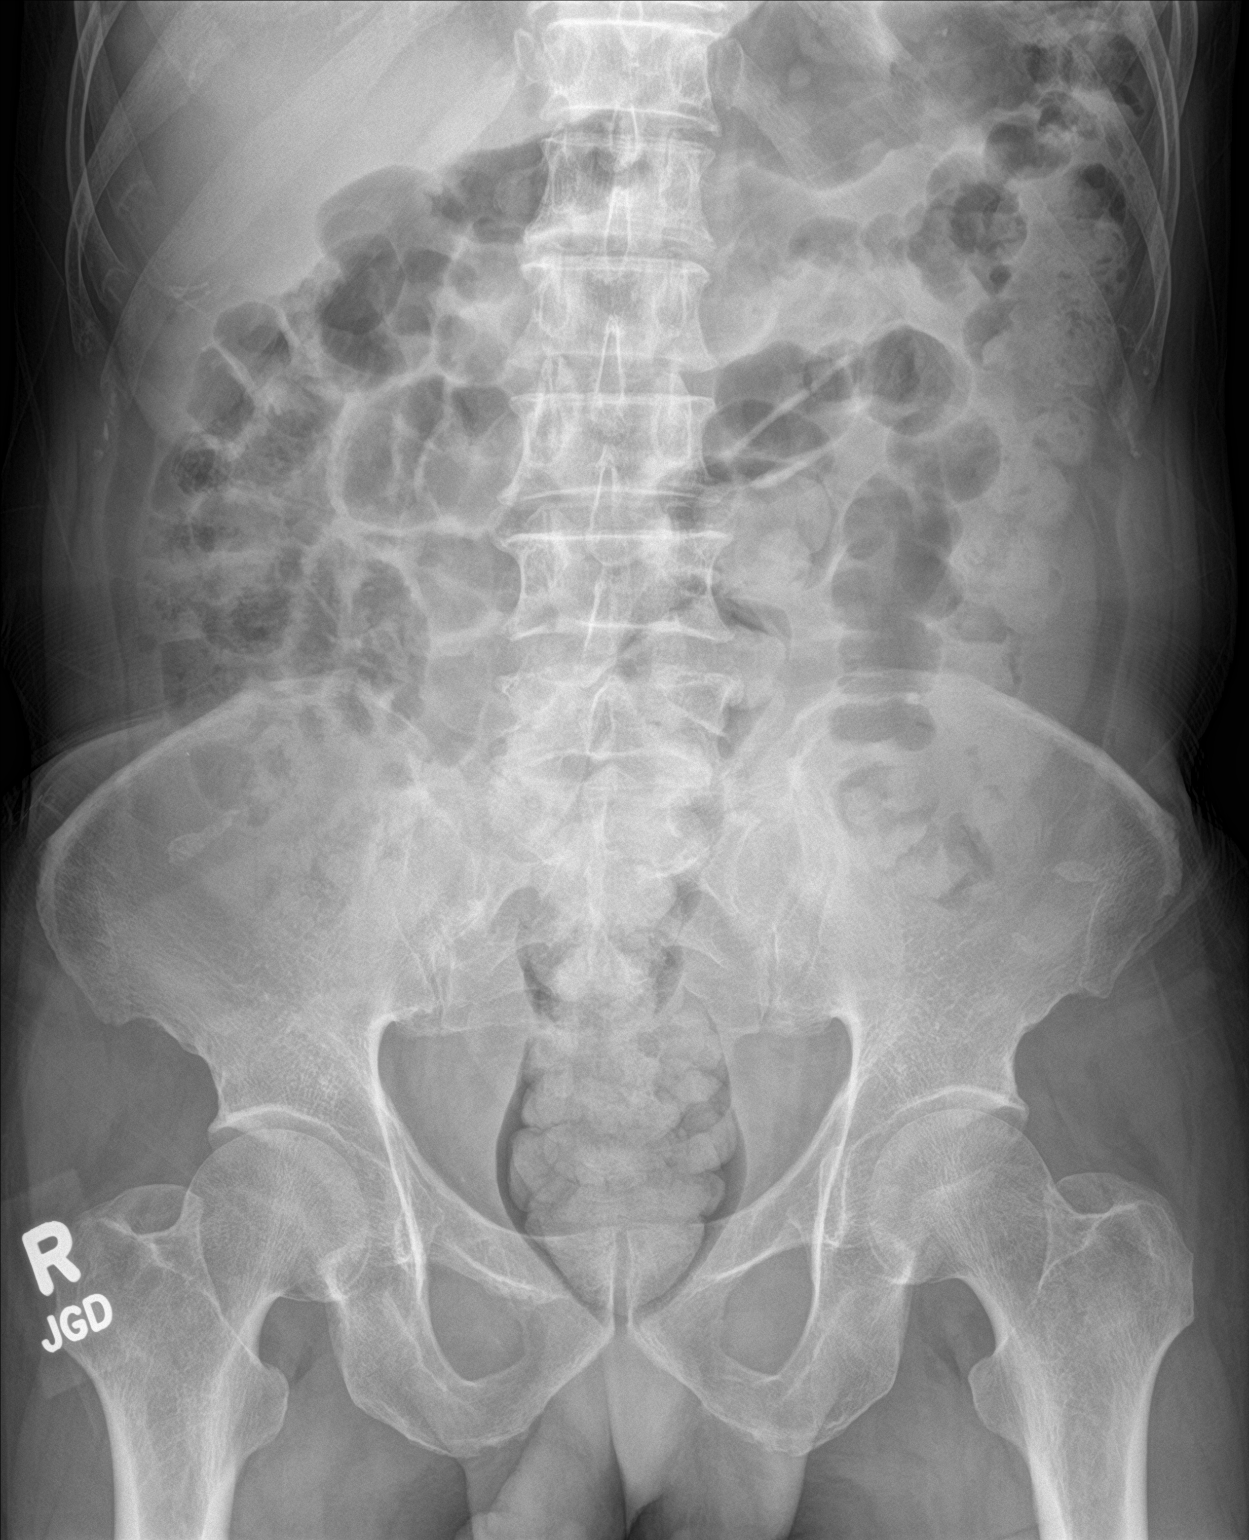

[1 of 1 positions shown; findings below may reference images not displayed]

FINDINGS: Nonobstructive pattern of bowel gas. Large burden of stool and stool
balls in the distal colon and rectum. No free air in the abdomen.
IMPRESSION: Nonobstructive pattern of bowel gas. Large burden of stool and stool
balls in the distal colon and rectum.

## 2023-12-29 ENCOUNTER — Ambulatory Visit: Payer: 59 | Attending: Internal Medicine

## 2023-12-29 VITALS — Ht 64.0 in | Wt 118.0 lb

## 2023-12-29 DIAGNOSIS — Z0000 Encounter for general adult medical examination without abnormal findings: Secondary | ICD-10-CM
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
## 2023-12-29 NOTE — Patient Instructions (Signed)
 Mr. [name] [name] , Thank you for taking time out of your busy schedule to complete your Annual Wellness Visit with me. I enjoyed our conversation and look forward to speaking with you again next year. I, as well as your care team,  appreciate your ongoing commitment to your health goals. Please review the following plan we discussed and let me know if I can assist you in the future. Your Game plan/ To Do List    Referrals: If you haven't heard from the office you've been referred to, please reach out to them at the phone provided.   Follow up Visits: We will see or speak with you next year for your Next Medicare AWV with our clinical staff Have you seen your provider in the last 6 months (3 months if uncontrolled diabetes)? Yes  Clinician Recommendations:  Aim for 30 minutes of exercise or brisk walking, 6-8 glasses of water , and 5 servings of fruits and vegetables each day.       This is a list of the screenings recommended for you:  Health Maintenance  Topic Date Due   COVID-19 Vaccine (3 - Pfizer risk series) 12/10/2019   Zoster (Shingles) Vaccine (2 of 2) 08/11/2023   Flu Shot  12/04/2023   Screening for Lung Cancer  09/06/2024   Cologuard (Stool DNA test)  10/11/2024   Medicare Annual Wellness Visit  12/28/2024   DTaP/Tdap/Td vaccine (2 - Td or Tdap) 10/10/2026   Pneumococcal Vaccine for age over 47  Completed   Hepatitis C Screening  Completed   HIV Screening  Completed   Hepatitis B Vaccine  Aged Out   HPV Vaccine  Aged Out   Meningitis B Vaccine  Aged Out    Advanced directives: (Declined) Advance directive discussed with you today. Even though you declined this today, please call our office should you change your mind, and we can give you the proper paperwork for you to fill out. Advance Care Planning is important because it:  [x]  Makes sure you receive the medical care that is consistent with your values, goals, and preferences  [x]  It provides guidance to your family and loved  ones and reduces their decisional burden about whether or not they are making the right decisions based on your wishes.  Follow the link provided in your after visit summary or read over the paperwork we have mailed to you to help you started getting your Advance Directives in place. If you need assistance in completing these, please reach out to us  so that we can help you!  See attachments for Preventive Care and Fall Prevention Tips.

## 2024-01-15 ENCOUNTER — Other Ambulatory Visit: Payer: Self-pay

## 2024-01-15 ENCOUNTER — Ambulatory Visit: Attending: Internal Medicine | Admitting: Internal Medicine

## 2024-01-15 ENCOUNTER — Encounter: Payer: Self-pay | Admitting: Internal Medicine

## 2024-01-15 ENCOUNTER — Other Ambulatory Visit: Payer: Self-pay | Admitting: Internal Medicine

## 2024-01-15 VITALS — BP 133/75 | HR 70 | Ht 64.0 in | Wt 122.0 lb

## 2024-01-15 DIAGNOSIS — M5416 Radiculopathy, lumbar region: Secondary | ICD-10-CM

## 2024-01-15 DIAGNOSIS — Z5902 Unsheltered homelessness: Secondary | ICD-10-CM | POA: Diagnosis not present

## 2024-01-15 DIAGNOSIS — Z79899 Other long term (current) drug therapy: Secondary | ICD-10-CM

## 2024-01-15 DIAGNOSIS — Z125 Encounter for screening for malignant neoplasm of prostate: Secondary | ICD-10-CM

## 2024-01-15 DIAGNOSIS — F17200 Nicotine dependence, unspecified, uncomplicated: Secondary | ICD-10-CM | POA: Diagnosis not present

## 2024-01-15 DIAGNOSIS — I10 Essential (primary) hypertension: Secondary | ICD-10-CM

## 2024-01-15 DIAGNOSIS — Z23 Encounter for immunization: Secondary | ICD-10-CM

## 2024-01-15 DIAGNOSIS — E782 Mixed hyperlipidemia: Secondary | ICD-10-CM

## 2024-01-15 DIAGNOSIS — Z139 Encounter for screening, unspecified: Secondary | ICD-10-CM

## 2024-01-15 MED ORDER — IBUPROFEN 800 MG PO TABS
800.0000 mg | ORAL_TABLET | Freq: Two times a day (BID) | ORAL | 1 refills | Status: DC | PRN
  Filled 2024-01-15: qty 60, 30d supply, fill #0

## 2024-01-15 MED ORDER — ZOSTER VAC RECOMB ADJUVANTED 50 MCG/0.5ML IM SUSR
0.5000 mL | Freq: Once | INTRAMUSCULAR | 0 refills | Status: AC
  Filled 2024-01-15: qty 1, 1d supply, fill #0

## 2024-01-15 MED ORDER — AMLODIPINE BESYLATE 10 MG PO TABS
10.0000 mg | ORAL_TABLET | Freq: Every day | ORAL | 1 refills | Status: DC
  Filled 2024-01-15: qty 90, 90d supply, fill #0
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
## 2024-01-15 NOTE — Patient Instructions (Addendum)
 [facility] ph. # [phone] F [phone] [address] [place], [place] [address]   VISIT SUMMARY:  Today, you came in for a routine follow-up visit to discuss your chronic back pain, hypertension, cholesterol, and smoking habits. We reviewed your current medications and discussed your pain management, blood pressure, and smoking cessation strategies.  YOUR PLAN:  -LUMBAR RADICULOPATHY WITH LUMBAR SPINAL STENOSIS: This condition involves nerve pain due to a protruding disc and narrowing of the spinal canal. We discussed the importance of consulting with a neurosurgeon to explore alternative treatments like steroid injections. You will receive contact information for [facility] to schedule a consultation. If the neurosurgeon does not offer alternatives, we may refer you to pain management. Your ibuprofen  prescription will be refilled, and you should take it as needed with food.  -ESSENTIAL HYPERTENSION: This is high blood pressure that is managed with medication. Your blood pressure is slightly above the target, so we will increase your amlodipine  dosage to 10 mg daily. It's important to reduce your salt intake to help manage your blood pressure. A prescription for the increased dosage will be sent to your pharmacy.  -MIXED HYPERLIPIDEMIA: This condition involves having high levels of different types of fats in your blood. You are currently managing this with rosuvastatin , and no changes are needed at this time.  -NICOTINE  DEPENDENCE: This is an addiction to tobacco. Despite the challenges of your current living situation, it's important to try to reduce smoking. We encourage you to continue making efforts to cut down on smoking.  INSTRUCTIONS:  Please schedule a consultation with [facility] using the contact information provided. Take your medications as prescribed, and make an effort to reduce your salt intake and smoking. Follow up with us  if you  experience any new symptoms or have any concerns.

## 2024-01-16 ENCOUNTER — Ambulatory Visit: Payer: Self-pay | Admitting: Internal Medicine

## 2024-01-16 LAB — CBC
Hematocrit: 49.5 % (ref 37.5–51.0)
Hemoglobin: 16.4 g/dL (ref 13.0–17.7)
MCH: 26.7 pg (ref 26.6–33.0)
MCHC: 33.1 g/dL (ref 31.5–35.7)
MCV: 81 fL (ref 79–97)
Platelets: 214 x10E3/uL (ref 150–450)
RBC: 6.15 x10E6/uL — ABNORMAL HIGH (ref 4.14–5.80)
RDW: 13.4 % (ref 11.6–15.4)
WBC: 9 x10E3/uL (ref 3.4–10.8)

## 2024-01-16 LAB — COMPREHENSIVE METABOLIC PANEL WITH GFR
ALT: 20 IU/L (ref 0–44)
AST: 18 IU/L (ref 0–40)
Albumin: 4.5 g/dL (ref 3.9–4.9)
Alkaline Phosphatase: 111 IU/L (ref 44–121)
BUN/Creatinine Ratio: 14 (ref 10–24)
BUN: 17 mg/dL (ref 8–27)
Bilirubin Total: 0.3 mg/dL (ref 0.0–1.2)
CO2: 27 mmol/L (ref 20–29)
Calcium: 9.9 mg/dL (ref 8.6–10.2)
Chloride: 102 mmol/L (ref 96–106)
Creatinine, Ser: 1.19 mg/dL (ref 0.76–1.27)
Globulin, Total: 2.1 g/dL (ref 1.5–4.5)
Glucose: 88 mg/dL (ref 70–99)
Potassium: 4.5 mmol/L (ref 3.5–5.2)
Sodium: 142 mmol/L (ref 134–144)
Total Protein: 6.6 g/dL (ref 6.0–8.5)
eGFR: 68 mL/min/1.73 (ref >59)

## 2024-01-16 LAB — LIPID PANEL
Chol/HDL Ratio: 1.7 ratio (ref 0.0–5.0)
Cholesterol, Total: 111 mg/dL (ref 100–199)
HDL: 64 mg/dL (ref >39)
LDL Chol Calc (NIH): 33 mg/dL (ref 0–99)
Triglycerides: 63 mg/dL (ref 0–149)
VLDL Cholesterol Cal: 14 mg/dL (ref 5–40)

## 2024-01-16 LAB — PSA: Prostate Specific Ag, Serum: 1.2 ng/mL (ref 0.0–4.0)

## 2024-01-18 ENCOUNTER — Other Ambulatory Visit: Payer: Self-pay

## 2024-01-22 ENCOUNTER — Telehealth: Payer: Self-pay | Admitting: *Deleted

## 2024-01-22 NOTE — Progress Notes (Signed)
 Complex Care Management Note  Care Guide Note 01/22/2024 Name: [name] [name] MRN: [id number] DOB: November 08, 1960  [name] [name] is a 63 y.o. year old male who sees Vicci Barnie NOVAK, MD for primary care. I reached out to Renay Mitten by phone today to offer complex care management services.  [name] was given information about Complex Care Management services today including:   The Complex Care Management services include support from the care team which includes your Nurse Care Manager, Clinical Social Worker, or Pharmacist.  The Complex Care Management team is here to help remove barriers to the health concerns and goals most important to you. Complex Care Management services are voluntary, and the patient may decline or stop services at any time by request to their care team member.   Complex Care Management Consent Status: Patient agreed to services and verbal consent obtained.   Follow up plan:  Telephone appointment with complex care management team member scheduled for:  9/22 with BSW and 9/29 with RNCM    Encounter Outcome:  Patient Scheduled Harlene Satterfield  [facility]  [facility], [facility] Guide  Direct Dial: [phone]  Fax [phone]

## 2024-01-25 ENCOUNTER — Telehealth: Payer: Self-pay

## 2024-01-25 ENCOUNTER — Other Ambulatory Visit: Payer: Self-pay

## 2024-01-25 NOTE — Telephone Encounter (Signed)
 Copied from CRM [id number]. Topic: General - Call Back - No Documentation >> Jan 25, 2024  3:01 PM Avram G wrote:  Reason for CRM: pt missed a call from [place] population, he had an appt today at 2:30. Please advise  [phone] (M)

## 2024-01-25 NOTE — Progress Notes (Signed)
   Telephone encounter was:  Unsuccessful.  01/25/2024 Name: [name] [name] MRN: [id number] DOB: [birth year]  Unsuccessful outbound call made today to assist with:  Food Insecurity  Outreach Attempt:  1st Attempt  Unable to leave a message   Jon Colt [facility]  [facility] Guide, Phone: [phone] Fax: [phone] Website: [email or web address]

## 2024-01-26 ENCOUNTER — Telehealth: Payer: Self-pay

## 2024-01-26 NOTE — Progress Notes (Signed)
   Telephone encounter was:  Successful.  Complex Care Management Note Care Guide Note  01/26/2024 Name: Axyl Sitzman MRN: [id number] DOB: 08/11/1960  Osric Klopf is a 63 y.o. year old male who is a primary care patient of Vicci Barnie NOVAK, MD . The community resource team was consulted for assistance with Food Insecurity and Financial Difficulties related to financial strain  SDOH screenings and interventions completed:  Yes  Social Drivers of Health From This Encounter   Housing: High Risk (01/26/2024)   Housing Stability Vital Sign    Unable to Pay for Housing in the Last Year: Yes    Number of Times Moved in the Last Year: 1    Homeless in the Last Year: Yes  Financial Resource Strain: High Risk (01/26/2024)   Overall Financial Resource Strain (CARDIA)    Difficulty of Paying Living Expenses: Very hard    SDOH Interventions Today    Flowsheet Row Most Recent Value  SDOH Interventions   Housing Interventions Community Resources Provided, [employer] Resource Referral  Financial Strain Interventions Community Resources Provided, [employer] Referral     Care guide performed the following interventions: Patient provided with information about care guide support team and interviewed to confirm resource needs.Pt stated he is homeless and his foodstamps have been cut to $23 a month. He cant afford food or housing he is staying in someones back yard in his car. Pt requested I mail resources for food housing and financial assistance   Follow Up Plan:  No further follow up planned at this time. The patient has been provided with needed resources.  Encounter Outcome:  Patient Visit Completed    Jon Colt [facility]  [facility] Guide, Phone: [phone] Fax: [phone] Website: [email or web address]

## 2024-02-01 ENCOUNTER — Other Ambulatory Visit: Payer: Self-pay

## 2024-02-01 NOTE — Patient Instructions (Signed)
 Visit Information  Thank you for taking time to visit with me today. Please don't hesitate to contact me if I can be of assistance to you before our next scheduled appointment.  Our next appointment is by telephone on 02/15/24 at 3:30 PM Please call the care guide team at [phone] if you need to cancel or reschedule your appointment.   Following is a copy of your care plan:   Goals Addressed             This Visit's Progress    VBCI RN Care Plan   On track    Problems:  Care Coordination needs related to Financial Strain , Food Insecurity , Housing , and Transportation  Goal: Over the next 30 days the Patient will take all medications exactly as prescribed and will call provider for medication related questions as evidenced by patient report of medication compliance    work with Child psychotherapist to address Financial constraints related to homelessness, Housing barriers, Limited access to food, and Transportation related to the management of chronic conditions as evidenced by review of electronic medical record and patient or social worker report      Interventions:   Evaluation of current treatment plan related to HLD and HTN, Financial constraints related to homelessness, Transportation, Limited access to food, and Housing barriers self-management and patient's adherence to plan as established by provider. Discussed plans with patient for ongoing care management follow up and provided patient with direct contact information for care management team Evaluation of current treatment plan related to HTN, HLD and patient's adherence to plan as established by provider Advised patient to look for his thermometer, ask friend if they have one, or purchase new thermometer due to concern for fever Reviewed medications with patient and discussed importance of medication compliance Screening for signs and symptoms of depression related to chronic disease state  Assessed social determinant of  health barriers Provided information regarding [facility] located today, 02/01/24, at the [facility] located at [address] [place], [place] [address]. Advised patient to try to go to clinic to get temperature checked due to concern for fever. Reviewed use of OTC medication to treat fever and advised patient to stay hydrated by drinking water   Patient Self-Care Activities:  Attend all scheduled provider appointments Call provider office for new concerns or questions  Take medications as prescribed   Work with the social worker to address care coordination needs and will continue to work with the clinical team to address health care and disease management related needs  Plan:  Telephone follow up appointment with care management team member scheduled for:  02/15/24 at 3:30 PM             Please call the Suicide and Crisis Lifeline: 988 call 1-800-273-TALK (toll free, 24 hour hotline) if you are experiencing a Mental Health or Behavioral Health Crisis or need someone to talk to.  Patient verbalizes understanding of instructions and care plan provided today and agrees to view in MyChart. Active MyChart status and patient understanding of how to access instructions and care plan via MyChart confirmed with patient.     Rosaline Finlay, RN MSN [place]  VBCI Population Health RN Care Manager Direct Dial: [phone]  Fax: [phone]

## 2024-02-01 NOTE — Patient Outreach (Signed)
 Complex Care Management   Visit Note  02/01/2024  Name:  [name] [name] MRN: [id number] DOB: 1960/11/26  Situation: Referral received for Complex Care Management related to SDOH Barriers:  Transportation Housing homelessness Food insecurity I obtained verbal consent from Patient.  Visit completed with Patient  on the phone  Background:   Past Medical History:  Diagnosis Date   Cataract    Headache    Hypertension     Assessment: Patient Reported Symptoms:  Cognitive Cognitive Status: Able to follow simple commands, Alert and oriented to person, place, and time, Normal speech and language skills Cognitive/Intellectual Conditions Management [RPT]: None reported or documented in medical history or problem list   Health Maintenance Behaviors: Annual physical exam  Neurological Neurological Review of Symptoms: No symptoms reported    HEENT HEENT Symptoms Reported: Other: HEENT Management Strategies: Coping strategies    Cardiovascular Cardiovascular Symptoms Reported: No symptoms reported Does patient have uncontrolled Hypertension?: No Cardiovascular Management Strategies: Medication therapy, Routine screening  Respiratory Respiratory Symptoms Reported: No symptoms reported    Endocrine Endocrine Symptoms Reported: No symptoms reported Is patient diabetic?: No    Gastrointestinal Gastrointestinal Symptoms Reported: No symptoms reported Additional Gastrointestinal Details: Patient reports a good appetite and regular BMs      Genitourinary Genitourinary Symptoms Reported: No symptoms reported    Integumentary Integumentary Symptoms Reported: No symptoms reported    Musculoskeletal Musculoskelatal Symptoms Reviewed: Joint pain Additional Musculoskeletal Details: Patient has chronic pain from my hip down to my right leg. He reports Advil  liquid gels help to provide pain relief Musculoskeletal Management Strategies: Medication therapy, Routine screening Falls in the past year?:  No Number of falls in past year: 1 or less Was there an injury with Fall?: No Fall Risk Category Calculator: 0 Patient Fall Risk Level: Low Fall Risk Patient at Risk for Falls Due to: No Fall Risks Fall risk Follow up: Falls evaluation completed, Education provided, Falls prevention discussed  Psychosocial Psychosocial Symptoms Reported: No symptoms reported     Quality of Family Relationships: non-existent Do you feel physically threatened by others?: No    02/01/2024    PHQ2-9 Depression Screening   Little interest or pleasure in doing things Not at all  Feeling down, depressed, or hopeless Not at all  PHQ-2 - Total Score 0  Trouble falling or staying asleep, or sleeping too much    Feeling tired or having little energy    Poor appetite or overeating     Feeling bad about yourself - or that you are a failure or have let yourself or your family down    Trouble concentrating on things, such as reading the newspaper or watching television    Moving or speaking so slowly that other people could have noticed.  Or the opposite - being so fidgety or restless that you have been moving around a lot more than usual    Thoughts that you would be better off dead, or hurting yourself in some way    PHQ2-9 Total Score    If you checked off any problems, how difficult have these problems made it for you to do your work, take care of things at home, or get along with other people    Depression Interventions/Treatment      There were no vitals filed for this visit.  Medications Reviewed Today     Reviewed by Arno Rosaline SQUIBB, RN (Registered Nurse) on 02/01/24 at 1048  Med List Status: <None>   Medication Order Taking? Sig Documenting  Provider Last Dose Status Informant  amLODipine  (NORVASC ) 10 MG tablet [id number] Yes Take 1 tablet (10 mg total) by mouth daily. Vicci Barnie NOVAK, MD  Active   diclofenac  Sodium (VOLTAREN ) 1 % GEL [id number]  Apply to affected area twice a day  Patient not  taking: Reported on 02/01/2024   Vicci Barnie NOVAK, MD  Active   ibuprofen  (ADVIL ) 800 MG tablet [id number] Yes Take 1 tablet (800 mg total) by mouth 2 (two) times daily as needed (take with food). Vicci Barnie NOVAK, MD  Active   pantoprazole  (PROTONIX ) 40 MG tablet [id number] Yes Take 1 tablet (40 mg total) by mouth daily. Vicci Barnie NOVAK, MD  Active   rosuvastatin  (CRESTOR ) 40 MG tablet [id number] Yes Take 1 tablet (40 mg total) by mouth daily. Brien Belvie BRAVO, MD  Active             Recommendation:   Continue Current Plan of Care [facility] to [facility] to get temperature checked today, 02/01/24, located at [address] [place], [place] [address] Work with BSW regarding SDOH needs  Follow Up Plan:   Telephone follow up appointment date/time:  02/15/24 at 3:30 PM  Rosaline Finlay, RN MSN [place]  [facility] Population Health RN Care Manager Direct Dial: [phone]  Fax: [phone]

## 2024-02-02 ENCOUNTER — Other Ambulatory Visit: Payer: Self-pay

## 2024-02-02 NOTE — Patient Instructions (Signed)
 Visit Information  Thank you for taking time to visit with me today. Please don't hesitate to contact me if I can be of assistance to you before our next scheduled appointment.  Our next appointment is no further scheduled appointments.   Please call the care guide team at [phone] if you need to cancel or reschedule your appointment.   Please call the Suicide and Crisis Lifeline: 988 call the [place]  National Suicide Prevention Lifeline: 920-258-5540 or TTY: 8651411891 TTY (802) 001-7399) to talk to a trained counselor call 1-800-273-TALK (toll free, 24 hour hotline) go to [facility] [address] [place] [phone]) call 911 if you are experiencing a Mental Health or Behavioral Health Crisis or need someone to talk to.  Patient verbalizes understanding of instructions and care plan provided today and agrees to view in MyChart. Active MyChart status and patient understanding of how to access instructions and care plan via MyChart confirmed with patient.     Orlean Fey, BSW [place]  [facility] Social Worker, [facility] [phone]

## 2024-02-02 NOTE — Patient Outreach (Addendum)
 Complex Care Management   Visit Note  02/02/2024  Name:  [name] [name] MRN: [id number] DOB: 28-Nov-1960  Situation: Referral received for Complex Care Management related to SDOH Barriers:  Housing   Food insecurity Financial Resource Strain I obtained verbal consent from Patient.  Visit completed with Patient  on the phone  Background:   Past Medical History:  Diagnosis Date   Cataract    Headache    Hypertension     Assessment:  BSW outreached patient today to assess for SDOH barriers. During the call, BSW was able to determine that housing, food insecurity, and financial resource strain are current barriers for this patient. Patient reported that he is currently living in a storage unit outside of someone's home, where he has access to water , a bathroom, and electricity. He rents the space from the homeowner because it is more affordable than traditional housing options. Patient stated he has been living in this arrangement for approximately one year and one month. Prior to this, he was living in his car. Patient reported that he previously received food stamps but that they were recently discontinued. He stated that he submitted a housing authority application in February 2025 and is currently waiting for availability. BSW offered to assist with submitting applications to other housing authorities in surrounding areas, but patient declined. BSW also suggested the possibility of sharing rent with a roommate or exploring shelter options, but patient stated he is not interested in shelters or shared housing at this time. Patient confirmed that transportation is not a barrier, as he utilizes his insurance for medical transportation when needed.  BSW will send patient a list of food pantries and housing resources. Case will be closed at this time, as no additional follow-up is needed unless requested by the patient.  Resources: Acupuncturist One Step Further Set designer of the [employer]   Recommendation:   No recommendations at this time.  Follow Up Plan:   Patient has met all care management goals. Care Management case will be closed. Patient has been provided contact information should new needs arise.   Orlean Fey, BSW [place]  [facility] Social Worker, [facility] [phone]

## 2024-02-03 ENCOUNTER — Other Ambulatory Visit: Payer: Self-pay | Admitting: Pharmacist

## 2024-02-03 ENCOUNTER — Other Ambulatory Visit: Payer: Self-pay

## 2024-02-03 MED ORDER — ROSUVASTATIN CALCIUM 40 MG PO TABS
40.0000 mg | ORAL_TABLET | Freq: Every day | ORAL | 1 refills | Status: DC
  Filled 2024-02-03: qty 90, 90d supply, fill #0

## 2024-02-03 NOTE — Progress Notes (Signed)
 Pharmacy Quality Measure Review  This patient is appearing on a report for being at risk of failing the adherence measure for cholesterol (statin) medications this calendar year.   Medication: rosuvastatin  Last fill date: 11/09/2023 for 90 day supply  Insurance report was not up to date. No action needed at this time.  Reminder set for next fill.   Herlene Fleeta Morris, PharmD, JAQUELINE, CPP Clinical Pharmacist [facility] [phone]

## 2024-02-05 ENCOUNTER — Other Ambulatory Visit: Payer: Self-pay

## 2024-02-05 ENCOUNTER — Other Ambulatory Visit: Payer: Self-pay | Admitting: Pharmacist

## 2024-02-05 NOTE — Progress Notes (Signed)
 Pharmacy Quality Measure Review  This patient is appearing on a report for being at risk of failing the adherence measure for cholesterol (statin) medications this calendar year.   Medication: rosuvastatin  Last fill date: 11/09/2023 for 90 day supply  Collaborated with our pharmacy. Rxn is ready for him to pick-up. I called the patient to inform him of this.   Herlene Fleeta Morris, PharmD, JAQUELINE, CPP Clinical Pharmacist [facility] & [facility] [phone]

## 2024-02-08 ENCOUNTER — Other Ambulatory Visit: Payer: Self-pay

## 2024-02-15 ENCOUNTER — Other Ambulatory Visit: Payer: Self-pay

## 2024-02-15 NOTE — Patient Outreach (Signed)
 Complex Care Management   Visit Note  02/15/2024  Name:  [name] [name] MRN: [id number] DOB: 1960-11-28  Situation: Referral received for Complex Care Management related to SDOH Barriers:  Housing   Food insecurity I obtained verbal consent from Patient.  Visit completed with Patient  on the phone  Background:   Past Medical History:  Diagnosis Date   Cataract    Headache    Hypertension     Assessment: Patient Reported Symptoms:  Cognitive Cognitive Status: Able to follow simple commands, Alert and oriented to person, place, and time, Normal speech and language skills Cognitive/Intellectual Conditions Management [RPT]: None reported or documented in medical history or problem list      Neurological Neurological Review of Symptoms: Not assessed    HEENT HEENT Symptoms Reported: Not assessed      Cardiovascular Cardiovascular Symptoms Reported: Not assessed    Respiratory Respiratory Symptoms Reported: Not assesed    Endocrine Endocrine Symptoms Reported: Not assessed Is patient diabetic?: No    Gastrointestinal Gastrointestinal Symptoms Reported: Not assessed      Genitourinary Genitourinary Symptoms Reported: Not assessed    Integumentary Integumentary Symptoms Reported: Not assessed    Musculoskeletal Musculoskelatal Symptoms Reviewed: Not assessed        Psychosocial Psychosocial Symptoms Reported: Not assessed          Medications Reviewed Today     Reviewed by Arno Rosaline SQUIBB, RN (Registered Nurse) on 02/15/24 at 1535  Med List Status: <None>   Medication Order Taking? Sig Documenting Provider Last Dose Status Informant  amLODipine  (NORVASC ) 10 MG tablet [id number]  Take 1 tablet (10 mg total) by mouth daily. Vicci Barnie NOVAK, MD  Active   diclofenac  Sodium (VOLTAREN ) 1 % GEL [id number]  Apply to affected area twice a day  Patient not taking: Reported on 02/01/2024   Vicci Barnie NOVAK, MD  Active   ibuprofen  (ADVIL ) 800 MG tablet [id number]  Take 1  tablet (800 mg total) by mouth 2 (two) times daily as needed (take with food). Vicci Barnie NOVAK, MD  Active   pantoprazole  (PROTONIX ) 40 MG tablet [id number]  Take 1 tablet (40 mg total) by mouth daily. Vicci Barnie NOVAK, MD  Active   rosuvastatin  (CRESTOR ) 40 MG tablet [id number]  Take 1 tablet (40 mg total) by mouth daily. Vicci Barnie NOVAK, MD  Active             Recommendation:   Continue Current Plan of Care Contact resources provided by [facility], and follow with navigator for any additional resources needed  Follow Up Plan:   Closing From:  Complex Care Management Patient has been connected with Care Navigator through [facility] Dual Complete plan - [place] [phone]  Rosaline Arno, RN MSN [place]  [facility] Health RN Care Manager Direct Dial: [phone]  Fax: [phone]

## 2024-02-15 NOTE — Patient Outreach (Signed)
 BSW has resent patient resources to his updated address.   [name] [name], BSW [place]  [facility] Social Worker, [facility] [phone]

## 2024-02-15 NOTE — Patient Instructions (Signed)
 Visit Information  Thank you for taking time to visit with me today. Please don't hesitate to contact me if I can be of assistance to you before our next scheduled appointment.  You have been connected with your Care Navigator through your [facility] Dual Complete Plan. Your Care Navigator's name is [place], and can be reached at [phone]. Please follow with you care navigator for resource needs.  Please call the care guide team at [phone] if you need to cancel, schedule, or reschedule an appointment.   Please call the Suicide and Crisis Lifeline: 988 call 1-800-273-TALK (toll free, 24 hour hotline) if you are experiencing a Mental Health or Behavioral Health Crisis or need someone to talk to.  Rosaline Finlay, RN MSN [place]  [facility] Population Health RN Care Manager Direct Dial: [phone]  Fax: [phone]

## 2024-05-09 ENCOUNTER — Telehealth: Payer: Self-pay | Admitting: Internal Medicine

## 2024-05-09 NOTE — Telephone Encounter (Signed)
 Transportation Voucher   Pick up date and time: 05/16/2024 2:30 pm.  Pick up address: [address] [place] [address]   Drop off address: [address], [address]

## 2024-05-09 NOTE — Telephone Encounter (Signed)
 Copied from CRM #[id number]. Topic: General - Transportation >> May 09, 2024  9:44 AM Tobias CROME wrote:  Reason for CRM: Patient requesting phone number for transportation.   Requesting callback: [phone]

## 2024-05-16 ENCOUNTER — Other Ambulatory Visit (HOSPITAL_COMMUNITY): Payer: Self-pay

## 2024-05-16 ENCOUNTER — Encounter: Payer: Self-pay | Admitting: Internal Medicine

## 2024-05-16 ENCOUNTER — Ambulatory Visit: Attending: Internal Medicine | Admitting: Internal Medicine

## 2024-05-16 ENCOUNTER — Other Ambulatory Visit: Payer: Self-pay

## 2024-05-16 VITALS — BP 132/74 | HR 71 | Ht 64.0 in | Wt 128.0 lb

## 2024-05-16 DIAGNOSIS — K219 Gastro-esophageal reflux disease without esophagitis: Secondary | ICD-10-CM

## 2024-05-16 DIAGNOSIS — R269 Unspecified abnormalities of gait and mobility: Secondary | ICD-10-CM | POA: Diagnosis not present

## 2024-05-16 DIAGNOSIS — F17200 Nicotine dependence, unspecified, uncomplicated: Secondary | ICD-10-CM | POA: Diagnosis not present

## 2024-05-16 DIAGNOSIS — Z5902 Unsheltered homelessness: Secondary | ICD-10-CM | POA: Diagnosis not present

## 2024-05-16 DIAGNOSIS — M48061 Spinal stenosis, lumbar region without neurogenic claudication: Secondary | ICD-10-CM

## 2024-05-16 DIAGNOSIS — E782 Mixed hyperlipidemia: Secondary | ICD-10-CM

## 2024-05-16 DIAGNOSIS — M4306 Spondylolysis, lumbar region: Secondary | ICD-10-CM | POA: Diagnosis not present

## 2024-05-16 DIAGNOSIS — I10 Essential (primary) hypertension: Secondary | ICD-10-CM | POA: Diagnosis not present

## 2024-05-16 MED ORDER — GABAPENTIN 300 MG PO CAPS
300.0000 mg | ORAL_CAPSULE | Freq: Every day | ORAL | 1 refills | Status: AC
  Filled 2024-05-16 (×2): qty 90, 90d supply, fill #0

## 2024-05-16 MED ORDER — PANTOPRAZOLE SODIUM 40 MG PO TBEC
40.0000 mg | DELAYED_RELEASE_TABLET | Freq: Every day | ORAL | 1 refills | Status: DC
  Filled 2024-05-16 (×2): qty 90, 90d supply, fill #0

## 2024-05-16 NOTE — Patient Instructions (Addendum)
" °  VISIT SUMMARY: During your follow-up visit, we discussed your hypertension, hyperlipidemia, acid reflux, chronic lower back pain, gait disturbance, tobacco dependence, and housing instability. Your blood pressure has improved, and your cholesterol management remains stable. We addressed your ongoing acid reflux and chronic lower back pain, and we discussed your need for a rollator walker. We also talked about your smoking habits and housing situation.  YOUR PLAN: -ESSENTIAL HYPERTENSION: Hypertension is high blood pressure. Your blood pressure has improved to 132/74 mmHg, and our goal is to keep it at 130/80 mmHg or lower. Continue taking amlodipine  10 mg daily.  -MIXED HYPERLIPIDEMIA: Hyperlipidemia is high cholesterol. Your cholesterol management is stable. Continue taking rosuvastatin  40 mg daily.  -GASTROESOPHAGEAL REFLUX DISEASE: Gastroesophageal reflux disease (GERD) is acid reflux. Your symptoms are partially controlled with pantoprazole . Avoid taking ibuprofen  as it worsens your symptoms.  -SPINAL STENOSIS OF LUMBAR REGION: Spinal stenosis is a narrowing of the spaces within your spine, which can cause lower back pain. You have been prescribed gabapentin  to take at bedtime to help manage the pain. Avoid driving or operating machinery due to potential drowsiness.  -GAIT DISTURBANCE: Gait disturbance is difficulty walking. You require assistance with a cane and have requested a rollator walker. We checked your insurance coverage for a rollator walker with [employer]. If they are not in network, contact an in-network medical supply store.  -TOBACCO DEPENDENCE: Tobacco dependence is an addiction to tobacco. You smoke half a pack per day and have had previous unsuccessful attempts to quit due to medication side effects. We discussed these previous attempts and side effects.  -UNSHELTERED HOMELESSNESS: You are currently living in a storage unit and have been unable to secure permanent housing.  Your housing application has been accepted, but no housing is available yet. Your recent insurance change may affect your resources.  INSTRUCTIONS: Please continue taking your medications as prescribed. Avoid ibuprofen  to help manage your acid reflux. Take gabapentin  at bedtime and avoid driving or operating machinery. Contact [employer] to check if they are in network for a rollator walker, and if not, find an in-network medical supply store. Follow up with your housing application and inform us  of any changes in your situation. Schedule a follow-up appointment in 3 months.                      Contains text generated by [employer].                                 Contains text generated by [employer]bridge.           Contains text generated by [employer]bridge.                          Contains text generated by [employer]bridge.                                 Contains text generated by [employer].   "
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
## 2024-05-17 ENCOUNTER — Other Ambulatory Visit: Payer: Self-pay

## 2024-05-18 ENCOUNTER — Encounter: Payer: Self-pay | Admitting: Pharmacist

## 2024-05-18 ENCOUNTER — Other Ambulatory Visit: Payer: Self-pay

## 2024-05-18 ENCOUNTER — Telehealth: Payer: Self-pay
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
## 2024-05-23 ENCOUNTER — Other Ambulatory Visit: Payer: Self-pay | Admitting: Internal Medicine

## 2024-05-23 ENCOUNTER — Other Ambulatory Visit: Payer: Self-pay

## 2024-05-23 ENCOUNTER — Other Ambulatory Visit (HOSPITAL_COMMUNITY): Payer: Self-pay

## 2024-05-23 DIAGNOSIS — I10 Essential (primary) hypertension: Secondary | ICD-10-CM

## 2024-05-23 DIAGNOSIS — R269 Unspecified abnormalities of gait and mobility: Secondary | ICD-10-CM

## 2024-05-23 DIAGNOSIS — K219 Gastro-esophageal reflux disease without esophagitis: Secondary | ICD-10-CM

## 2024-05-23 DIAGNOSIS — M48061 Spinal stenosis, lumbar region without neurogenic claudication: Secondary | ICD-10-CM

## 2024-05-23 NOTE — Telephone Encounter (Unsigned)
 Copied from CRM #[id number]. Topic: Clinical - Medication Refill >> May 23, 2024 11:08 AM Wess RAMAN wrote: Medication: amLODipine  (NORVASC ) 10 MG tablet  pantoprazole  (PROTONIX ) 40 MG tablet  rosuvastatin  (CRESTOR ) 40 MG tablet  gabapentin  (NEURONTIN ) 300 MG capsule   Has the patient contacted their pharmacy? Yes (Agent: If no, request that the patient contact the pharmacy for the refill. If patient does not wish to contact the pharmacy document the reason why and proceed with request.) (Agent: If yes, when and what did the pharmacy advise?)  This is the patient's preferred pharmacy:  [facility] - [place], [place] - [address] [facility] AT [facility] to [facility] [address] [place] [place] [address] Phone: [phone] Fax: [phone] Hours: Not open 24 hours   Is this the correct pharmacy for this prescription? Yes If no, delete pharmacy and type the correct one.   Has the prescription been filled recently? Yes  Is the patient out of the medication? Yes out of heart medications  Has the patient been seen for an appointment in the last year OR does the patient have an upcoming appointment? Yes  Can we respond through MyChart? Yes  Agent: Please be advised that Rx refills may take up to 3 business days. We ask that you follow-up with your pharmacy.

## 2024-05-24 MED ORDER — GABAPENTIN 300 MG PO CAPS: 300.0000 mg | ORAL_CAPSULE | Freq: Every day | ORAL | 1 refills | Status: AC

## 2024-05-24 MED ORDER — PANTOPRAZOLE SODIUM 40 MG PO TBEC: 40.0000 mg | DELAYED_RELEASE_TABLET | Freq: Every day | ORAL | 1 refills | Status: AC

## 2024-05-24 MED ORDER — ROSUVASTATIN CALCIUM 40 MG PO TABS: 40.0000 mg | ORAL_TABLET | Freq: Every day | ORAL | 1 refills | Status: AC

## 2024-05-24 MED ORDER — AMLODIPINE BESYLATE 10 MG PO TABS: 10.0000 mg | ORAL_TABLET | Freq: Every day | ORAL | 1 refills | Status: AC

## 2024-05-24 NOTE — Telephone Encounter (Signed)
 Requested Prescriptions  Pending Prescriptions Disp Refills   amLODipine  (NORVASC ) 10 MG tablet 90 tablet 1    Sig: Take 1 tablet (10 mg total) by mouth daily.     Cardiovascular: Calcium  Channel Blockers 2 Passed - 05/24/2024 11:18 AM      Passed - Last BP in normal range    BP Readings from Last 1 Encounters:  05/16/24 132/74         Passed - Last Heart Rate in normal range    Pulse Readings from Last 1 Encounters:  05/16/24 71         Passed - Valid encounter within last 6 months    Recent Outpatient Visits           1 week ago Essential hypertension   [facility] Dept Of [place]. [facility] Vicci Barnie NOVAK, MD   4 months ago Essential hypertension   [facility] - [facility] Dept Of [place]. [facility] Vicci Barnie NOVAK, MD   7 months ago Acute right lumbar radiculopathy   [facility] - A Dept Of [place]. [facility]ci Barnie NOVAK, MD   11 months ago Essential hypertension   [facility] - A Dept Of [place]. [facility] Vicci Barnie NOVAK, MD   1 year ago Essential hypertension   [facility] - A Dept Of [place]. [facility] Brien Belvie BRAVO, MD               gabapentin  (NEURONTIN ) 300 MG capsule 90 capsule 1    Sig: Take 1 capsule (300 mg total) by mouth at bedtime.     Neurology: Anticonvulsants - gabapentin  Passed - 05/24/2024 11:18 AM      Passed - Cr in normal range and within 360 days    Creat  Date Value Ref Range Status  06/17/2016 0.92 0.70 - 1.33 mg/dL Final    Comment:      For patients > or = 66 years of age: The upper reference limit for Creatinine is approximately 13% higher for people identified as African-American.      Creatinine, Ser  Date Value Ref Range Status  01/15/2024 1.19 0.76 - 1.27 mg/dL Final   Creatinine, POC  Date Value Ref Range Status  10/28/2016 200 mg/dL Final    Creatinine, Urine  Date Value Ref Range Status  06/17/2016 37 20 - 370 mg/dL Final         Passed - Completed PHQ-2 or PHQ-9 in the last 360 days      Passed - Valid encounter within last 12 months    Recent Outpatient Visits           1 week ago Essential hypertension   [facility] Dept Of [place]. [facility] Vicci Barnie NOVAK, MD   4 months ago Essential hypertension   [facility] Dept Of [place]. [facility] Vicci Barnie NOVAK, MD   7 months ago Acute right lumbar radiculopathy   [facility] - A Dept Of [place]. [facility] Vicci Barnie NOVAK, MD   11 months ago Essential hypertension   [facility] - A Dept Of [place]. [facility]ci Barnie NOVAK, MD   1 year ago Essential hypertension   [facility] - A Dept Of [place]. [facility] Brien Belvie BRAVO, MD               pantoprazole  (PROTONIX ) 40 MG tablet 90 tablet 1    Sig: Take 1 tablet (40 mg total) by mouth daily.     Gastroenterology: Proton Pump Inhibitors Passed - 05/24/2024 11:18 AM      Passed - Valid encounter within last 12 months    Recent Outpatient Visits           1 week ago Essential hypertension   [facility] - [facility] Dept Of [place]. [facility] Vicci Barnie NOVAK, MD   4 months ago Essential hypertension   [facility] - [facility] Dept Of [place]. [facility] Vicci Barnie NOVAK, MD   7 months ago Acute right lumbar radiculopathy   [facility] - A Dept Of [place]. [facility] Vicci Barnie NOVAK, MD   11 months ago Essential hypertension   [facility] - A Dept Of [place]. [facility] Vicci Barnie NOVAK, MD   1 year ago Essential hypertension   [facility] - A Dept Of [place]. [facility] Brien Belvie BRAVO, MD               rosuvastatin  (CRESTOR ) 40 MG tablet 90 tablet 1    Sig: Take 1 tablet (40 mg total) by mouth daily.     Cardiovascular:  Antilipid - Statins 2 Failed - 05/24/2024 11:18 AM      Failed - Lipid Panel in normal range within the last 12 months    Cholesterol, Total  Date Value Ref Range Status  01/15/2024 111 100 - 199 mg/dL Final   LDL Chol Calc (NIH)  Date Value Ref Range Status  01/15/2024 33 0 - 99 mg/dL Final   HDL  Date Value Ref Range Status  01/15/2024 64 >39 mg/dL Final   Triglycerides  Date Value Ref Range Status  01/15/2024 63 0 - 149 mg/dL Final         Passed - Cr in normal range and within 360 days    Creat  Date Value Ref Range Status  06/17/2016 0.92 0.70 - 1.33 mg/dL Final    Comment:      For patients > or = 66 years of age: The upper reference limit for Creatinine is approximately 13% higher for people identified as African-American.      Creatinine, Ser  Date Value Ref Range Status  01/15/2024 1.19 0.76 - 1.27 mg/dL Final   Creatinine, POC  Date Value Ref Range Status  10/28/2016 200 mg/dL Final   Creatinine, Urine  Date Value Ref Range Status  06/17/2016 37 20 - 370 mg/dL Final         Passed - Patient is not pregnant      Passed - Valid encounter within last 12 months    Recent Outpatient Visits           1 week ago Essential hypertension   [facility] - [facility] Dept Of [place]. [facility] Vicci Barnie NOVAK, MD   4 months ago Essential hypertension   [facility] - A Dept Of [place]. [facility] Vicci Barnie NOVAK, MD   7 months ago Acute right lumbar radiculopathy   [facility]  [place] - A Dept Of [place]. [facility] Vicci Barnie NOVAK, MD   11 months ago Essential hypertension   [facility] - A Dept Of [place]. [facility] Vicci Barnie NOVAK, MD   1 year ago Essential hypertension   [facility] - A Dept Of [place]. [facility] Brien Belvie BRAVO, MD

## 2024-10-17 ENCOUNTER — Ambulatory Visit: Payer: Self-pay | Admitting: Internal Medicine

## 2024-11-07 ENCOUNTER — Ambulatory Visit: Admitting: Internal Medicine

## 2025-01-03 ENCOUNTER — Ambulatory Visit
